# Patient Record
Sex: Male | Born: 1980 | Race: White | Hispanic: No | Marital: Single | Smoking: Current every day smoker
Health system: Southern US, Community
[De-identification: ages and names within clinical notes are randomized; demographics above are authoritative.]

## PROBLEM LIST (undated history)

## (undated) DIAGNOSIS — G629 Polyneuropathy, unspecified: Secondary | ICD-10-CM

## (undated) DIAGNOSIS — E669 Obesity, unspecified: Secondary | ICD-10-CM

## (undated) DIAGNOSIS — I10 Essential (primary) hypertension: Secondary | ICD-10-CM

## (undated) DIAGNOSIS — F17200 Nicotine dependence, unspecified, uncomplicated: Secondary | ICD-10-CM

## (undated) DIAGNOSIS — E119 Type 2 diabetes mellitus without complications: Secondary | ICD-10-CM

## (undated) DIAGNOSIS — A630 Anogenital (venereal) warts: Secondary | ICD-10-CM

## (undated) DIAGNOSIS — F329 Major depressive disorder, single episode, unspecified: Secondary | ICD-10-CM

## (undated) DIAGNOSIS — J301 Allergic rhinitis due to pollen: Secondary | ICD-10-CM

## (undated) DIAGNOSIS — K2100 Gastro-esophageal reflux disease with esophagitis, without bleeding: Secondary | ICD-10-CM

## (undated) DIAGNOSIS — G43909 Migraine, unspecified, not intractable, without status migrainosus: Secondary | ICD-10-CM

## (undated) DIAGNOSIS — N410 Acute prostatitis: Secondary | ICD-10-CM

## (undated) DIAGNOSIS — G4700 Insomnia, unspecified: Secondary | ICD-10-CM

## (undated) DIAGNOSIS — J45909 Unspecified asthma, uncomplicated: Secondary | ICD-10-CM

## (undated) DIAGNOSIS — M5137 Other intervertebral disc degeneration, lumbosacral region: Secondary | ICD-10-CM

## (undated) DIAGNOSIS — F32A Depression, unspecified: Secondary | ICD-10-CM

## (undated) DIAGNOSIS — M545 Low back pain, unspecified: Secondary | ICD-10-CM

## (undated) DIAGNOSIS — K219 Gastro-esophageal reflux disease without esophagitis: Secondary | ICD-10-CM

## (undated) DIAGNOSIS — K589 Irritable bowel syndrome without diarrhea: Secondary | ICD-10-CM

## (undated) DIAGNOSIS — Z833 Family history of diabetes mellitus: Secondary | ICD-10-CM

## (undated) DIAGNOSIS — M51379 Other intervertebral disc degeneration, lumbosacral region without mention of lumbar back pain or lower extremity pain: Secondary | ICD-10-CM

## (undated) HISTORY — DX: Essential (primary) hypertension: I10

## (undated) HISTORY — DX: Other intervertebral disc degeneration, lumbosacral region without mention of lumbar back pain or lower extremity pain: M51.379

## (undated) HISTORY — DX: Low back pain, unspecified: M54.50

## (undated) HISTORY — PX: OTHER SURGICAL HISTORY: SHX169

## (undated) HISTORY — DX: Major depressive disorder, single episode, unspecified: F32.9

## (undated) HISTORY — DX: Gastro-esophageal reflux disease without esophagitis: K21.9

## (undated) HISTORY — DX: Migraine, unspecified, not intractable, without status migrainosus: G43.909

## (undated) HISTORY — DX: Gastro-esophageal reflux disease with esophagitis, without bleeding: K21.00

## (undated) HISTORY — DX: Low back pain: M54.5

## (undated) HISTORY — DX: Other intervertebral disc degeneration, lumbosacral region: M51.37

## (undated) HISTORY — DX: Acute prostatitis: N41.0

## (undated) HISTORY — DX: Obesity, unspecified: E66.9

## (undated) HISTORY — DX: Family history of diabetes mellitus: Z83.3

## (undated) HISTORY — DX: Insomnia, unspecified: G47.00

## (undated) HISTORY — DX: Gastro-esophageal reflux disease with esophagitis: K21.0

## (undated) HISTORY — DX: Allergic rhinitis due to pollen: J30.1

## (undated) HISTORY — DX: Depression, unspecified: F32.A

## (undated) HISTORY — DX: Anogenital (venereal) warts: A63.0

## (undated) HISTORY — DX: Irritable bowel syndrome, unspecified: K58.9

## (undated) HISTORY — DX: Nicotine dependence, unspecified, uncomplicated: F17.200

---

## 2007-04-05 HISTORY — PX: ESOPHAGOGASTRODUODENOSCOPY: SHX1529

## 2008-05-08 ENCOUNTER — Ambulatory Visit: Payer: Self-pay | Admitting: Gastroenterology

## 2008-05-08 LAB — CONVERTED CEMR LAB
AST: 15 units/L (ref 0–37)
Albumin: 4.5 g/dL (ref 3.5–5.2)
CO2: 20 meq/L (ref 19–32)
Calcium: 8.8 mg/dL (ref 8.4–10.5)
Chloride: 105 meq/L (ref 96–112)
Creatinine, Ser: 1.06 mg/dL (ref 0.40–1.50)
Eosinophils Absolute: 0.6 10*3/uL (ref 0.0–0.7)
Glucose, Bld: 85 mg/dL (ref 70–99)
HCT: 47.3 % (ref 39.0–52.0)
Lymphs Abs: 2.6 10*3/uL (ref 0.7–4.0)
MCHC: 34 g/dL (ref 30.0–36.0)
MCV: 86.5 fL (ref 78.0–100.0)
Platelets: 276 10*3/uL (ref 150–400)
Potassium: 4.2 meq/L (ref 3.5–5.3)
RBC: 5.47 M/uL (ref 4.22–5.81)
Sodium: 141 meq/L (ref 135–145)
WBC: 9.7 10*3/uL (ref 4.0–10.5)

## 2008-08-19 DIAGNOSIS — R1110 Vomiting, unspecified: Secondary | ICD-10-CM

## 2008-08-19 DIAGNOSIS — K625 Hemorrhage of anus and rectum: Secondary | ICD-10-CM

## 2008-08-19 DIAGNOSIS — R109 Unspecified abdominal pain: Secondary | ICD-10-CM | POA: Insufficient documentation

## 2008-08-19 DIAGNOSIS — R197 Diarrhea, unspecified: Secondary | ICD-10-CM

## 2009-02-05 ENCOUNTER — Encounter: Payer: Self-pay | Admitting: Orthopedic Surgery

## 2009-02-05 ENCOUNTER — Ambulatory Visit (HOSPITAL_COMMUNITY): Admission: RE | Admit: 2009-02-05 | Discharge: 2009-02-05 | Payer: Self-pay | Admitting: Family Medicine

## 2009-04-03 ENCOUNTER — Encounter: Payer: Self-pay | Admitting: Orthopedic Surgery

## 2009-04-03 ENCOUNTER — Ambulatory Visit (HOSPITAL_COMMUNITY): Admission: RE | Admit: 2009-04-03 | Discharge: 2009-04-03 | Payer: Self-pay | Admitting: Family Medicine

## 2009-04-20 ENCOUNTER — Ambulatory Visit: Payer: Self-pay | Admitting: Orthopedic Surgery

## 2009-04-20 DIAGNOSIS — M659 Unspecified synovitis and tenosynovitis, unspecified site: Secondary | ICD-10-CM | POA: Insufficient documentation

## 2009-06-17 ENCOUNTER — Telehealth (INDEPENDENT_AMBULATORY_CARE_PROVIDER_SITE_OTHER): Payer: Self-pay

## 2009-06-17 ENCOUNTER — Ambulatory Visit: Payer: Self-pay | Admitting: Gastroenterology

## 2009-06-17 DIAGNOSIS — R1013 Epigastric pain: Secondary | ICD-10-CM

## 2009-06-17 DIAGNOSIS — K3189 Other diseases of stomach and duodenum: Secondary | ICD-10-CM

## 2009-07-07 ENCOUNTER — Ambulatory Visit (HOSPITAL_COMMUNITY)
Admission: RE | Admit: 2009-07-07 | Discharge: 2009-07-07 | Payer: Self-pay | Source: Home / Self Care | Admitting: Gastroenterology

## 2010-01-02 ENCOUNTER — Encounter: Payer: Self-pay | Admitting: Orthopedic Surgery

## 2010-01-06 ENCOUNTER — Ambulatory Visit: Payer: Self-pay | Admitting: Orthopedic Surgery

## 2010-01-06 DIAGNOSIS — M7580 Other shoulder lesions, unspecified shoulder: Secondary | ICD-10-CM

## 2010-01-06 DIAGNOSIS — M25519 Pain in unspecified shoulder: Secondary | ICD-10-CM | POA: Insufficient documentation

## 2010-01-08 ENCOUNTER — Telehealth: Payer: Self-pay | Admitting: Orthopedic Surgery

## 2010-05-03 ENCOUNTER — Encounter (HOSPITAL_COMMUNITY)
Admission: RE | Admit: 2010-05-03 | Discharge: 2010-05-18 | Payer: Self-pay | Source: Home / Self Care | Attending: Orthopedic Surgery | Admitting: Orthopedic Surgery

## 2010-05-03 ENCOUNTER — Ambulatory Visit (HOSPITAL_COMMUNITY)
Admission: RE | Admit: 2010-05-03 | Discharge: 2010-05-03 | Payer: Self-pay | Source: Home / Self Care | Attending: Family Medicine | Admitting: Family Medicine

## 2010-05-18 NOTE — Assessment & Plan Note (Signed)
Summary: ABD PAIN, DIARRHEA, GERD   Visit Type:  Follow-up Visit Primary Care Provider:  Sudie Bailey, M.D.  Chief Complaint:  F/U abd pain.  History of Present Illness: Having problems with acid coming back. Cut back on coffee. Lost 22 lbs since last year-been not eating a lot. Was scared and he didn't want to be asleep for 3 days.  Feells like food gets stuck: 2-3x/week. Vomtis: s/t with eating, smoking, or drinking. hasn't stopped smoking yet but is on Chantix and it didn't work. CHANTIX CAUSED SUICIDAL THOUGHTS. Rare problems with pain with swallowing. Feeling depressed-not seeing a [facility].  For vomtiing only using OMP. May awaken with reflux: most nights with BM or throats on fire.  Problems with diarrhea, about the same. BMs: 4-6/day. Rare blood: ? frequency. Pain sometimes in belly, BLQ. Watery stool, and can have normal stool. No dysuria, hematuria, SOB. Not using anything for diarrhea.  Preventive Screening-Counseling & Management  Alcohol-Tobacco     Smoking Status: current      Drug Use:  no.    Current Medications (verified): 1)  Tylenol 325 Mg Tabs (Acetaminophen) .... As Needed 2)  Advil 200 Mg Tabs (Ibuprofen) .... As Needed 3)  Omeprazole 20 Mg Cpdr (Omeprazole) .... Take 1 Tablet By Mouth Two Times A Day  Allergies: 1)  ! Pcn  Past History:  Past Medical History: Last updated: 04/20/2009 reflux depression  seasonal allergies migraines  Past Surgical History: Last updated: 04/20/2009 na  Family History: FH of Colon Cancer: mother age 33 No family history polyps maternal grandmother had stomach/ovarian CA  Social History: Occupation: disability from mental illness Patient currently smokes.  Alcohol Use - no Illicit Drug Use - no Single, just lost a child Smoking Status:  current Drug Use:  no  Review of Systems       Per HPI, otherwise all systems negative.  May have jock itch. No joint pain, rash on legs, or sores in his mouth. Left shoulder feels  better.  Vital Signs:  Patient profile:   29 year old male Height:      63 inches Weight:      259 pounds BMI:     46.05 Temp:     98.0 degrees F oral Pulse rate:   84 / minute BP sitting:   130 / 92  (left arm) Cuff size:   large  Vitals Entered By: Cloria Spring LPN (June 17, 1608 10:15 AM)  Physical Exam  General:  Well developed, well nourished, no acute distress. Head:  Normocephalic and atraumatic. Eyes:  PERRLA, no icterus. Mouth:  No deformity or lesions, dentition POOR. Neck:  Supple; no masses. Lungs:  Clear throughout to auscultation. Heart:  Regular rate and rhythm; no murmurs. Abdomen:  Soft, nontender and nondistended. No masses, hepatosplenomegaly or hernias noted. Normal bowel sounds. obese.   Extremities:  No RASH, cyanosis, edema or deformities noted. Neurologic:  Alert and  oriented x4;  grossly normal neurologically.  Impression & Recommendations:  Problem # 1:  DYSPEPSIA (ICD-536.8)  Most likely 2o to non-ulcer dyspepsia or non-acid reflux. Doubt H. pylori gastritis. EGD BRAVO PLACEMENT IN OR MAR 24. STUDY ON two times a day OMP. BIOPSY DUODENUM to evaluate for celiac sprue. Continue omeprazole. Take omeprazole 30 minutes prior to meals two times a day. RETURN VISIT IN 3 MONTHS.  Orders: Est. Patient Level V (96045)  Problem # 2:  RECTAL BLEEDING (ICD-569.3) Most likely 2o to hemorrhoids. Differential includes colon polyp, less likely colon cancer. TCS  WITH RANDOM BIOPSIES TO evaluate for microscopic colitis. Halflytely prep.   Problem # 3:  DIARRHEA (ICD-787.91) Assessment: Unchanged Most likely 2o to IBS-d, doubt celiac sprue or microscopic colitism giardiasis, or cdiff. Stool studies. Avoid dairy. SEE HANDOUT. Take Digestive Advantage Lactose Intolerance daily. Need upper and lower endoscopy MAR 24. ADD LEVSIN 1-2 under your tongue 30 mins before meals. LEVSIN helps with diarrhea and abd cramps.  Orders: T-Stool Giardia / Crypto- EIA  (34742) T-Fecal WBC (59563-87564) T-Culture, C-Diff Toxin A/B (33295-18841) T-Culture, C-Diff Toxin A/B (66063-01601) T-Culture, C-Diff Toxin A/B (09323-55732) Est. Patient Level V (20254)  Patient Instructions: 1)  Continue omeprazole.  2)  Take omeprazole 30 minutes prior to meals two times a day. 3)  Avoid dairy. SEE HANDOUT. 4)  Take Digestive Advantage Lactose Intolerance daily. 5)  Need upper and lower endoscopy MAR 24. 6)  ADD LEVSIN 1-2 under your tongue 30 mins before meals. 7)  LEVSIN helps with diarrhea and abd cramps. 8)  RETURN VISIT IN 3 MONTHS. 9)  The medication list was reviewed and reconciled.  All changed / newly prescribed medications were explained.  A complete medication list was provided to the patient / caregiver. Prescriptions: LEVSIN/SL 0.125 MG SUBL (HYOSCYAMINE SULFATE) 1-2 sl 30 minutes before meals up to 4 times a day  #90 x 5   Entered and Authorized by:   West Bali MD   Signed by:   West Bali MD on 06/17/2009   Method used:   Electronically to        [facility] (retail)       [address] [place], [place]  [address]       Ph: [phone]       Fax: [phone]   RxID:   [id number] OMEPRAZOLE 20 MG CPDR (OMEPRAZOLE) Take 1 tablet by mouth two times a day, 30 minute before meals  #60 x 5   Entered and Authorized by:   West Bali MD   Signed by:   West Bali MD on 06/17/2009   Method used:   Electronically to        [facility] (retail)       [address]       [place], [place]  [address]       Ph: [phone]       Fax: [phone]   RxID:   [id number]

## 2010-05-18 NOTE — Letter (Signed)
Summary: History form  History form   Imported By: Jacklynn Ganong 04/21/2009 15:40:25  _____________________________________________________________________  External Attachment:    Type:   Image     Comment:   External Document

## 2010-05-18 NOTE — Progress Notes (Signed)
Summary: Referral to Dr. Dion Saucier.  Phone Note Outgoing Call   Call placed by: Waldon Reining,  January 08, 2010 10:09 AM Call placed to: Specialist Action Taken: Information Sent Summary of Call: I faxed a referral for this patient to Dr. Dion Saucier to be seen for his shoulder pain, synovitis.

## 2010-05-18 NOTE — Assessment & Plan Note (Signed)
Summary: RE-CK LT SHOULDER/PAIN RECURRING/POSS.NEW XRAY/[place] MEDICAID/CAF   Visit Type:  Follow-up Referring Allen Palmer:  Dr. Sudie Palmer Primary Allen Palmer:  Allen Palmer, M.D.  CC:  left shoulder pain.  History of Present Illness: I saw Allen Palmer in the office today for a followup visit.  He is a 30 years old man with the complaint of:  left shoulder pain.  I last saw him in January of this year and he had an MRI and a plain film and on MRI and a large joint effusion with perhaps synovitis no tears.  Left shoulder MRI and Xray for review from 02/05/09.  he was treated with injection, cortisone and Lorcet plus and got relief for approximately 3 days.  He was seen again by his primary care physician who gave him what I think was a subacromial injection as well as an intramuscular deltoid injection on the LEFT and increases pain medication to Lorcet 10 mg but he did not improve and is referred back  His pain is worse at night and at 7/10   Allergies: 1)  ! Pcn  Past History:  Past Medical History: Last updated: 04/20/2009 reflux depression  seasonal allergies migraines  Past Surgical History: Last updated: 04/20/2009 na  Family History: Last updated: 06/17/2009 FH of Colon Cancer: mother age 82 No family history polyps maternal grandmother had stomach/ovarian CA  Social History: Last updated: 06/17/2009 Occupation: disability from mental illness Patient currently smokes.  Alcohol Use - no Illicit Drug Use - no Single, just lost a child  Risk Factors: Smoking Status: current (06/17/2009)  Review of Systems Constitutional:  Denies weight loss, weight gain, fever, chills, and fatigue. Neurologic:  Denies numbness and tingling. Musculoskeletal:  Complains of joint pain.  Physical Exam  Additional Exam:  This is a fairly large male in no acute distress with no gross deformity  He has normal distal pulses with no palpable abnormalities.  As no lymphadenopathy.  Skin is  warm dry and intact with no rash.  Has normal sensation his LEFT upper extremity and he's awake alert and oriented x3.  He ambulates normally  On inspection there is no warmth or tenderness to palpation around the shoulder.  His pain is with range of motion.  This includes abduction flexion and internal and external rotation.  However his passive range of motion is normal.  His active range of motion is limited to 90 of forward elevation 90 of abduction  I did take no history deficits in his rotator cuff on internal or external rotation or forward elevation.  Empty can test normal.  Shoulder feels stable with  inferior stress as well as abduction external rotation.  I do see some  some mild impingement and some pain with abduction external rotation at the extreme of motion.     Impression & Recommendations: very unclear as to what is going on with his shoulder.  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  He may just have impingement.  Like him to get a second opinion and return to me and let me review that T., with the treatment plan.  I think the pain medication and is going to be an issue or may become an issue and I would like to limit that and put him on an anti-inflammatory instead  Medications Added to Medication List This Visit: 1)  Celebrex 200 Mg Caps (Celecoxib) .Marland Kitchen.. 1 daily orally   h/o reflux  Other Orders: Orthopedic Surgeon Referral (  Ortho Surgeon) Est. Patient Level III [phone])  Patient Instructions: 1)  2nd opinion with Dr Dion Saucier (Shoulder Specialist)  2)  Take celebrex 200 mg daily  3)  return after 2nd opinion  Prescriptions: CELEBREX 200 MG CAPS (CELECOXIB) 1 daily orally   h/o reflux  #60 x 1   Entered and Authorized by:   Fuller Canada MD   Signed by:   Fuller Canada MD on 01/06/2010   Method used:   Faxed to ...       [facility] (retail)       [address]       [place], [place]  [address]       Ph: [phone]       Fax: [phone]   RxID:   [id number]

## 2010-05-18 NOTE — Progress Notes (Signed)
Summary: levsin too expensive  Phone Note Call from Patient Call back at Home Phone [phone]   Caller: Patient Summary of Call: pt called- went to pharmacy and levsin is $55.00 and he cant afford it. wants to know if there is anything cheaper he can take? please advise Initial call taken by: Hendricks Limes LPN,  June 17, 2009 3:20 PM     Appended Document: levsin too expensive Change to Dicyclomine 10 mg 1-2 by mouth qac, up to 4 times a day, #90 rfx5.  Appended Document: levsin too expensive Rx called to [facility], pts mother aware

## 2010-05-18 NOTE — Letter (Signed)
Summary: tcs/egd order  tcs/egd order   Imported By: Ave Filter 06/17/2009 11:46:11  _____________________________________________________________________  External Attachment:    Type:   Image     Comment:   External Document  Appended Document: tcs/egd order Pt no showed x2 for procedure per slf made pt a f/u visit with her 09/24/09@9 :30a.m.

## 2010-05-18 NOTE — Letter (Signed)
Summary: *Orthopedic Consult Note  [facility]  [address] [place] [address]  [place], [place] [address]   Phone: [phone]  Fax: [phone]    Re:    Allen Palmer DOB:    1980/04/19   Dear: Brett Canales    Thank you for requesting that we see the above patient for consultation.  A copy of the detailed office note will be sent under separate cover, for your review.  Evaluation today is consistent with: benign synovitis of the LEFT shoulder. Recommendation is for injection and oral prednisone for 6 weeks.       Thank you for this opportunity to look after your patient.  Sincerely,   Terrance Mass. MD.

## 2010-05-18 NOTE — Letter (Signed)
Summary: Referral notes from Dr. Sudie Bailey  Referral notes from Dr. Sudie Bailey   Imported By: Jacklynn Ganong 01/08/2010 10:35:24  _____________________________________________________________________  External Attachment:    Type:   Image     Comment:   External Document

## 2010-05-18 NOTE — Assessment & Plan Note (Signed)
Summary: LT SHOULDER PAIN/HAD XR/MRI [facility] 04/03/09,BRING'G FILM/REF KNO...   Vital Signs:  Patient profile:   30 year old male Weight:      247 pounds Pulse rate:   84 / minute Resp:     16 per minute  Vitals Entered By: Fuller Canada MD (April 20, 2009 1:56 PM)  Visit Type:  Initial Consult Primary Provider:  Dr Sudie Bailey   CC:  left shoulder pain.  History of Present Illness: I saw Allen Palmer in the office today for an initial visit.  He is a 30 years old man with the complaint of:  chief complaint: left shoulder pain  30 year old male with pain in his LEFT shoulder for 3 months. He rates it an 8/10. Denies any trauma. Pain is worse at night improved Lorcet plus, and aspirin. He describes it as aching, throbbing, sometimes sharp associated with painful range of motion. He denies any stiffness or catching, no locking, or popping. No weakness no numbness.  -xrays done & where: Left shoulder 02/05/09, MRI 04/03/09 for review.  No injections or therapy.  Meds: Omeprazole, Cipro, Dicyclomine, Clindamycin, Fexofenadine, Hydrocodone 7.5.  Allergies: 1)  ! Pcn  Past History:  Past Medical History: reflux depression  seasonal allergies migraines  Past Surgical History: na  Family History: na  Social History: na  Review of Systems General:  Complains of fever; denies weight loss, weight gain, chills, and fatigue. Cardiac :  Complains of poor circulation; denies chest pain, angina, heart attack, heart failure, blood clots, and phlebitis. Resp:  Complains of cough; denies short of breath, difficulty breathing, COPD, and pneumonia. GI:  Complains of vomiting, diarrhea, constipation, and reflux; denies nausea, difficulty swallowing, ulcers, and GERD. Neuro:  Complains of headache, dizziness, and migraines; denies numbness, weakness, tremor, and unsteady walking. MS:  Complains of joint pain and joint swelling; denies rheumatoid arthritis, gout, bone cancer,  osteoporosis, and . Psych:  Complains of depression; denies mood swings, anxiety, panic attack, bipolar, and schizophrenia. Immunology:  Complains of seasonal allergies, sinus problems, and allergic to bee stings.  The review of systems is negative for GU, Endo, Derm, EENT, and Lymphatic.  Physical Exam  Msk:  vsigns are stable as recorded  Medium to large body habitus,  He's oriented x3.  His mood is flat.  Gait and station are normal.  Skin is normal. The LEFT and RIGHT shoulder, as well as the cervical spine and upper back.  He has normal radial and ulnar pulses without temperature no edema in either arm. He has negative lymphadenopathy in the axilla. Normal sensation in both upper extremities. His reflexes are excellent coordination and balance are good.  His RIGHT and LEFT shoulder on inspection show no swelling. He has full range of motion in both with increased pain in [place] elevation on the LEFT side. Both shoulders are stable. Strength is normal.     Impression & Recommendations:  Problem # 1:  SYNOVITIS (ICD-727.00)  Data: MRI of the LEFT shoulder with x-rays of LEFT shoulder reviewed with their corresponding reports I agree that the plain films are normal in that there is no evidence of rotator cuff or labral injury, there is fluid in the joint, consistent with a synovitis.  Inject LEFT shoulder Verbal consent obtained/The shoulder was injected with depomedrol 40mg /cc and sensorcaine .25% . There were no complications   Assessment: The MRI was normal. There was no trauma to bring on the pain is a nonspecific synovitis. Recommend medical treatment with prednisone. Followup if  not improved after 6 weeks  Orders: Consultation Level III ([address]) Joint Aspirate / Injection, Large ([address]10) Depo- Medrol 40mg  (J1030)  Medications Added to Medication List This Visit: 1)  Lorcet Plus 7.5-650 Mg Tabs (Hydrocodone-acetaminophen) .... One by mouth q 4 hrs as needed pain 2)   Prednisone 10 Mg Tabs (Prednisone) .... One by mouth daily for 6 weeks  Patient Instructions: 1)  You have received an injection of cortisone today. You may experience increased pain at the injection site. Apply ice pack to the area for 20 minutes every 2 hours and take 2 xtra strength tylenol every 8 hours. This increased pain will usually resolve in 24 hours. The injection will take effect in 3-10 days.  2)  Take this medicine once a day for 6 weeks 3)  call us after 6 weeks if not any better. Prescriptions: PREDNISONE 10 MG TABS (PREDNISONE) one by mouth daily for 6 weeks  #42 x 0   Entered and Authorized by:   Fuller Canada MD   Signed by:   Fuller Canada MD on 04/20/2009   Method used:   Print then Give to Patient   RxID:   [id number]

## 2010-05-31 ENCOUNTER — Ambulatory Visit (HOSPITAL_COMMUNITY): Payer: Self-pay | Admitting: Specialist

## 2010-07-12 LAB — BASIC METABOLIC PANEL
GFR calc Af Amer: >60 mL/min (ref >60)
Potassium: 3.7 mEq/L (ref 3.5–5.1)

## 2010-08-09 ENCOUNTER — Ambulatory Visit (HOSPITAL_COMMUNITY): Payer: Self-pay | Admitting: Specialist

## 2010-08-31 NOTE — Consult Note (Signed)
NAME:  Allen Palmer, Allen Palmer               ACCOUNT NO.:  [id number]   MEDICAL RECORD NO.:  [id number]          PATIENT TYPE:  AMB   LOCATION:  DAY                           FACILITY:  [facility]   PHYSICIAN:  Kassie Mends, M.D.           DATE OF BIRTH:   DATE OF CONSULTATION:  05/08/2008  DATE OF DISCHARGE:                                 CONSULTATION   REASON FOR CONSULTATION:  Abdominal pain, diarrhea, rectal bleeding, and  vomiting.   HISTORY OF PRESENT ILLNESS:  Allen Palmer is a 30 year old male who was  having normal daily stools until approximately 3 years ago.  He now can  have up to 3-4 bouts of diarrhea a day.  If he is not having diarrhea,  he is not having bowel movements at all.  He states he is stopped up. He  has pain in the right lower quadrant that radiates to his right upper  quadrant.  His pain is made worse by spicy foods and moving around.  Advil makes better.  He complains of swelling in his abdomen every  night.  He drinks 5 cans of soda (Pepsi or Coke) daily.  He drinks 4-6  cups of caffeinated coffee daily.  He has cream with his coffee. Dr.  Michelle Nasuti office refer to an abdominal ultrasound, which was  unremarkable. In December 2009, he was 264 pounds. If he takes his  Nexium, he does not have any symptoms.  He has also been tried on  omeprazole.  He has been off his medication for a year.   He also has been treated for reflux disease for the last 2-3 years.  He  complains of vomiting twice a day.  His vomiting usually occurs either  spontaneously in the morning or after he brushes his teeth.  His vomit  occurs after eating at nighttime.  He reports of having upper endoscopy  in [place], [place].  He had an upper endoscopy which he  became uncooperative during the exam and was a limited exam. He  sometimes has problems with swallowing both solids and liquids.  He uses  4 ibuprofens daily for his abdominal pain.  He also complains of seeing  blood in his  stool.  He is also complains of blood in his urine.  He has  never had a colonoscopy.  He has multiple fractured teeth in his mouth,  but complains of no teeth pain.  He rarely consumes milk.  He does not  consume ice cream or cheese.  He has been on antibiotics in the last 4  months.  He has well water.  He has not traveled outside of the country.   PAST MEDICAL HISTORY:  Abdominal pain.   PAST SURGICAL HISTORY:  None.   ALLERGIES:  PENICILLIN just about chilled me when I was 2.   MEDICATIONS:  1. Tylenol as needed.  2  Advil 4 daily.   FAMILY HISTORY:  His mother has reflux.  He has no family history colon  cancer or colon polyps.  He has a cousin with Crohn  disease.   SOCIAL HISTORY:  He is single and has no children.  He is currently on  Medicaid.  He smokes half a pack a day.  He denies any alcohol use.   REVIEW OF SYSTEMS:  Per the HPI, otherwise all systems are negative.   PHYSICAL EXAMINATION:  VITAL SIGNS:  Weight 269 pounds, height 6 feet 3  inches, temperature 97.9, blood pressure 128/80, and pulse 60.  GENERAL:  He is in no apparent distress and smells like cigarette smoke.  He is alert and oriented x4.HEENT:  Atraumatic and normocephalic.  Pupils equal and reactive to light.  Mouth, no oral lesions.  Posterior  oropharynx without erythema or exudate.  He has extremely poor  dentition.NECK:  Full range of motion.  No lymphadenopathy.LUNGS:  Clear  to auscultation bilaterally.CARDIOVASCULAR:  Regular rhythm.  No murmur.  Normal S1 and S2.ABDOMEN:  Bowel sounds are present, soft, nondistended,  obese, no hepatosplenomegaly.  No abdominal bruits.  He has increasing  pain in his right lower quadrant and right flank with Carnett maneuvers  consistent with abdominal wall pain.EXTREMITIES:  No cyanosis or  edema.SKIN:  No pretibial lesions.NEUROLOGIC:  No focal neurologic  deficits.   ASSESSMENT:  Allen Palmer is a 30 year old male most likely has abdominal  pain which is  multifactorial.  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  He has vomiting, which is likely secondary to uncontrolled  gastroesophageal reflux disease.  The differential diagnosis also  includes NSAID gastritis or H. pylori gastritis.  He has diarrhea and  rectal bleeding, which is likely secondary to irritable bowel syndrome  with hemorrhoids.  However, the differential diagnosis includes NSAID  colitis, and a low likelihood of inflammatory bowel disease or  colorectal polyps or malignancy.  He could also have celiac sprue. Thank  you for allowing me to see Allen Palmer in consultation.  My  recommendations as follow.   RECOMMENDATIONS:  1. He is to start omeprazole 20 mg twice daily.  He was instructed to      take 30 minutes before his first and last meal.  We will also add      Bentyl 10 mg 30 minutes before his first and last meal.  He is      warned that Bentyl may cause drowsiness, dry eyes, dry mouth, and      urinary retention.  2. Will obtain CBC, complete metabolic panel, fecal lactoferrin, C.      diff toxin, routine stool culture, and Giardia antigen.  Has a low      likelihood that his chronic diarrhea is secondary to an infectious      process, but it remains in the differential diagnosis (Giardiasis,      C. diff colitis, and bacterial gastroenteritis).  3. He was extremely agitated with his upper endoscopy in [facility] and so he will have a upper endoscopy and a colonoscopy on      next week with propofol.  The endoscopy will be performed to      evaluate his rectal bleeding, vomiting, diarrhea, and abdominal      pain.  He has had half lightly bowel prep.  We will biopsy his      colon and his duodenum.  He has a low likelihood that microscopic      colitis could be causing his diarrhea.  4. He was given the [facility] handout and I explained to him.  I      recommended that  he lose to 10-20 pounds, stop smoking, and stop      consuming a large quantity of caffeinated beverages, and carbonated      beverages.  He was given a handout on items and which may cause      bloating.  He also was given a low-fat diet handout.  5. Will attempt to obtain records from Dr. Sudie Bailey or [facility]      to obtain the actual radiographic and      endoscopic reports.  6. He has a follow up appointment to see me in 2 months.   ADDENDUM 78295:  Pt has not had EGD/TCS performed.      Kassie Mends, M.D.  Electronically Signed     SM/MEDQ  D:  05/08/2008  T:  05/09/2008  Job:  [id number]   cc:   Mila Homer. Sudie Bailey, M.D.  Fax: [phone]

## 2011-08-04 NOTE — H&P (Signed)
NTS SOAP Note  Vital Signs:  Vitals as of: 08/04/2011: Systolic 143: Diastolic 96: Heart Rate 104: Temp 99.95F: Height 46ft 3in: Weight 297Lbs 0 Ounces: OFC 0in: Respiratory Rate 0: O2 Saturation 0: Pain Level 8: BMI 37  BMI : 37.12 kg/m2  Subjective: This 31 Years 1 Months old Male presents for of anal wart.  They have been present for some time, but have not been medically addressed.  Patient did not want to discuss sexually history.  Review of Symptoms:  Constitutional:fatigue headaches Eyes:blurred vision bilateral,pain bilateral ear and sinus infection Cardiovascular:unremarkable Respiratory:wheezing,cough abdominal issues Genitourinary:unremarkable joint, back, neck pain Skin:unremarkable Hematolgic/Lymphatic:unremarkable Allergic/Immunologic:unremarkable   Past Medical History:Reviewed   Past Medical History  Surgical History: unremarkable Medical Problems: HTN, asthma Allergies: PCN Medications: HCTZ, omeprazole, proair, kdur, temazepam, celebrex   Social History:Reviewed  Social History  Preferred Language: English ([place]) Race:  White Ethnicity: Not Hispanic / Latino Age: 31 Years 1 Months Marital Status:  S Sexual Activity: could not discuss as mother in the room Alcohol:  No Recreational drug(s):  No   Smoking Status: Current every day smoker reviewed on 08/04/2011 Started Date: 04/18/2000 Packs per day: 0.50   Family History:Reviewed   Family History  Is there a family history of:No family h/o colon carcinoma    Objective Information: General:Well appearing, well nourished in no distress. Head:Atraumatic; no masses; no abnormalities Heart:RRR, no murmur or gallop.  Normal S1, S2.  No S3, S4.  Lungs:CTA bilaterally, no wheezes, rhonchi, rales.  Breathing unlabored. wart like lesions at the anal verge and anterior dentate line  Assessment:Condylloma, anal  Diagnosis  &amp; Procedure: DiagnosisCode: 078.11, ProcedureCode: 57846,    Plan:Will schedule for rectal examination, fulgeration of anal condyloma on 08/17/11.   Patient Education:Alternative treatments to surgery were discussed with patient (and family).Risks and benefits  of procedure were fully explained to the patient (and family) who gave informed consent. Patient/family questions were addressed.  Follow-up:Pending Surgery

## 2011-08-11 ENCOUNTER — Encounter (HOSPITAL_COMMUNITY): Payer: Self-pay | Admitting: Pharmacy Technician

## 2011-08-11 NOTE — Patient Instructions (Signed)
[name]  08/11/2011   Your procedure is scheduled on: 5.1.13  Report to [facility] at 0700 AM.  Call this number if you have problems the morning of surgery: [phone]   Remember:   Do not eat food:After Midnight.  May have clear liquids:until Midnight .  Clear liquids include soda, tea, black coffee, apple or grape juice, broth.  Take these medicines the morning of surgery with A SIP OF WATER : omeprazole, proair   Do not wear jewelry, make-up or nail polish.  Do not wear lotions, powders, or perfumes. You may wear deodorant.  Do not shave 48 hours prior to surgery.  Do not bring valuables to the hospital.  Contacts, dentures or bridgework may not be worn into surgery.  Leave suitcase in the car. After surgery it may be brought to your room.  For patients admitted to the hospital, checkout time is 11:00 AM the day of discharge.   Patients discharged the day of surgery will not be allowed to drive home.  Name and phone number of your driver: family  Special Instructions: CHG Shower Use Special Wash: 1/2 bottle night before surgery and 1/2 bottle morning of surgery.   Please read over the following fact sheets that you were given: Pain Booklet, MRSA Information, Surgical Site Infection Prevention, Anesthesia Post-op Instructions and Care and Recovery After Surgery   PATIENT INSTRUCTIONS POST-ANESTHESIA  IMMEDIATELY FOLLOWING SURGERY:  Do not drive or operate machinery for the first twenty four hours after surgery.  Do not make any important decisions for twenty four hours after surgery or while taking narcotic pain medications or sedatives.  If you develop intractable nausea and vomiting or a severe headache please notify your doctor immediately.  FOLLOW-UP:  Please make an appointment with your surgeon as instructed. You do not need to follow up with anesthesia unless specifically instructed to do so.  WOUND CARE INSTRUCTIONS (if applicable):  Keep a dry clean dressing on  the anesthesia/puncture wound site if there is drainage.  Once the wound has quit draining you may leave it open to air.  Generally you should leave the bandage intact for twenty four hours unless there is drainage.  If the epidural site drains for more than 36-48 hours please call the anesthesia department.  QUESTIONS?:  Please feel free to call your physician or the hospital operator if you have any questions, and they will be happy to assist you.     [facility] Anesthesia Department [address] [place] [phone]

## 2011-08-12 ENCOUNTER — Encounter (HOSPITAL_COMMUNITY)
Admission: RE | Admit: 2011-08-12 | Discharge: 2011-08-12 | Payer: Medicare Other | Source: Ambulatory Visit | Attending: General Surgery | Admitting: General Surgery

## 2011-08-17 ENCOUNTER — Encounter (HOSPITAL_COMMUNITY): Admission: RE | Payer: Self-pay | Source: Ambulatory Visit

## 2011-08-17 ENCOUNTER — Encounter (HOSPITAL_COMMUNITY): Payer: Self-pay | Admitting: Anesthesiology

## 2011-08-17 ENCOUNTER — Ambulatory Visit (HOSPITAL_COMMUNITY): Admission: RE | Admit: 2011-08-17 | Payer: Medicare Other | Source: Ambulatory Visit | Admitting: General Surgery

## 2011-08-17 SURGERY — EXAM UNDER ANESTHESIA
Anesthesia: Choice

## 2011-08-29 ENCOUNTER — Encounter (HOSPITAL_COMMUNITY): Admission: RE | Admit: 2011-08-29 | Discharge: 2011-08-29 | Payer: Medicare Other | Source: Ambulatory Visit

## 2011-08-29 NOTE — Patient Instructions (Addendum)
  [name]  08/29/2011   Your procedure is scheduled on:  5.17.13  Report to [facility] at 0720 AM.  Call this number if you have problems the morning of surgery: [phone]   Remember:   Do not eat food:After Midnight.  May have clear liquids:until Midnight .  Clear liquids include soda, tea, black coffee, apple or grape juice, broth.  Take these medicines the morning of surgery with A SIP OF WATER: albuterol, celebrex   Do not wear jewelry, make-up or nail polish.  Do not wear lotions, powders, or perfumes. You may wear deodorant.  Do not shave 48 hours prior to surgery.  Do not bring valuables to the hospital.  Contacts, dentures or bridgework may not be worn into surgery.  Leave suitcase in the car. After surgery it may be brought to your room.  For patients admitted to the hospital, checkout time is 11:00 AM the day of discharge.   Patients discharged the day of surgery will not be allowed to drive home.  Name and phone number of your driver: family  Special Instructions: CHG Shower Use Special Wash: 1/2 bottle night before surgery and 1/2 bottle morning of surgery.   Please read over the following fact sheets that you were given: Pain Booklet, MRSA Information, Surgical Site Infection Prevention, Anesthesia Post-op Instructions and Care and Recovery After Surgery   PATIENT INSTRUCTIONS POST-ANESTHESIA  IMMEDIATELY FOLLOWING SURGERY:  Do not drive or operate machinery for the first twenty four hours after surgery.  Do not make any important decisions for twenty four hours after surgery or while taking narcotic pain medications or sedatives.  If you develop intractable nausea and vomiting or a severe headache please notify your doctor immediately.  FOLLOW-UP:  Please make an appointment with your surgeon as instructed. You do not need to follow up with anesthesia unless specifically instructed to do so.  WOUND CARE INSTRUCTIONS (if applicable):  Keep a dry clean dressing  on the anesthesia/puncture wound site if there is drainage.  Once the wound has quit draining you may leave it open to air.  Generally you should leave the bandage intact for twenty four hours unless there is drainage.  If the epidural site drains for more than 36-48 hours please call the anesthesia department.  QUESTIONS?:  Please feel free to call your physician or the hospital operator if you have any questions, and they will be happy to assist you.

## 2011-09-02 ENCOUNTER — Encounter (HOSPITAL_COMMUNITY): Admission: RE | Payer: Self-pay | Source: Ambulatory Visit

## 2011-09-02 ENCOUNTER — Ambulatory Visit (HOSPITAL_COMMUNITY): Admission: RE | Admit: 2011-09-02 | Payer: Medicare Other | Source: Ambulatory Visit | Admitting: General Surgery

## 2011-09-02 SURGERY — EXAM UNDER ANESTHESIA
Anesthesia: General

## 2011-10-10 ENCOUNTER — Ambulatory Visit (HOSPITAL_COMMUNITY)
Admission: RE | Admit: 2011-10-10 | Discharge: 2011-10-10 | Disposition: A | Discharge status: Home / Self Care | Payer: Medicare Other | Source: Ambulatory Visit | Attending: Family Medicine | Admitting: Family Medicine

## 2011-10-10 ENCOUNTER — Other Ambulatory Visit (HOSPITAL_COMMUNITY): Payer: Self-pay | Admitting: Family Medicine

## 2011-10-10 DIAGNOSIS — M545 Low back pain, unspecified: Secondary | ICD-10-CM | POA: Insufficient documentation

## 2011-10-10 DIAGNOSIS — M546 Pain in thoracic spine: Secondary | ICD-10-CM | POA: Insufficient documentation

## 2011-10-10 DIAGNOSIS — M51379 Other intervertebral disc degeneration, lumbosacral region without mention of lumbar back pain or lower extremity pain: Secondary | ICD-10-CM | POA: Insufficient documentation

## 2011-10-10 DIAGNOSIS — M5137 Other intervertebral disc degeneration, lumbosacral region: Secondary | ICD-10-CM | POA: Insufficient documentation

## 2012-01-04 ENCOUNTER — Encounter: Payer: Self-pay | Admitting: Gastroenterology

## 2012-01-04 ENCOUNTER — Ambulatory Visit: Payer: Medicare Other | Admitting: Gastroenterology

## 2012-01-12 ENCOUNTER — Encounter: Payer: Self-pay | Admitting: Gastroenterology

## 2012-01-12 ENCOUNTER — Ambulatory Visit (INDEPENDENT_AMBULATORY_CARE_PROVIDER_SITE_OTHER): Payer: Medicare Other | Admitting: Gastroenterology

## 2012-01-12 VITALS — BP 133/80 | HR 104 | Temp 98.1°F | Ht 75.0 in | Wt 314.4 lb

## 2012-01-12 DIAGNOSIS — R1013 Epigastric pain: Secondary | ICD-10-CM

## 2012-01-12 DIAGNOSIS — K3189 Other diseases of stomach and duodenum: Secondary | ICD-10-CM

## 2012-01-12 DIAGNOSIS — K219 Gastro-esophageal reflux disease without esophagitis: Secondary | ICD-10-CM

## 2012-01-12 DIAGNOSIS — R197 Diarrhea, unspecified: Secondary | ICD-10-CM

## 2012-01-12 MED ORDER — PEG 3350-KCL-NA BICARB-NACL 420 G PO SOLR: 4000.0000 mL | ORAL | Status: DC

## 2012-01-12 MED ORDER — ESOMEPRAZOLE MAGNESIUM 40 MG PO CPDR: 40.0000 mg | DELAYED_RELEASE_CAPSULE | Freq: Two times a day (BID) | ORAL | Status: DC

## 2012-01-12 MED ORDER — DICYCLOMINE HCL 10 MG PO CAPS
10.0000 mg | ORAL_CAPSULE | Freq: Three times a day (TID) | ORAL | Status: DC
Start: 2012-01-12 — End: 2012-06-21
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
## 2012-01-12 NOTE — Patient Instructions (Addendum)
Stop Protonix. Start taking Nexium 30 minutes before breakfast and dinner.   Start taking Bentyl 10 mg before meals and at bedtime. No more than 4 doses per day.  We have set you up for a colonoscopy and upper endoscopy in the near future. Further recommendations to follow shortly.

## 2012-01-17 DIAGNOSIS — K219 Gastro-esophageal reflux disease without esophagitis: Secondary | ICD-10-CM | POA: Insufficient documentation

## 2012-01-17 NOTE — Assessment & Plan Note (Signed)
Chronic. Notes upper abdominal pain as well as diffuse abdominal discomfort. Question non-ulcer dyspepsia, chronic abdominal pain, IBS-related. Proceed with EGD/TCS.

## 2012-01-17 NOTE — Assessment & Plan Note (Signed)
Significant, failed Protonix and Omeprazole. +nocturnal symptoms. Vague dysphagia noted. Wt up 55 lbs since 2011. Symptoms secondary to diet and behavior issues. Needs aggressive wt management, dietary modification. Proceed with EGD with small bowel biopsy to assess for celiac sprue at time of TCS. Possible dilation if needed; vague dysphagia may be secondary to uncontrolled GERD, unable to r/o esophageal web, ring, stricture. Wt gain is assuring though. Change PPI to Nexium BID.   Proceed with upper endoscopy/dilation  in the near future with Dr. Darrick Penna. The risks, benefits, and alternatives have been discussed in detail with patient. They have stated understanding and desire to proceed.  PROPOFOL

## 2012-01-17 NOTE — Progress Notes (Signed)
Faxed to PCP
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
## 2012-01-25 ENCOUNTER — Encounter (HOSPITAL_COMMUNITY): Payer: Self-pay

## 2012-01-31 ENCOUNTER — Inpatient Hospital Stay (HOSPITAL_COMMUNITY): Admission: RE | Admit: 2012-01-31 | Discharge: 2012-01-31 | Payer: Medicare Other | Source: Ambulatory Visit

## 2012-01-31 NOTE — Patient Instructions (Addendum)
[name]  01/31/2012   Your procedure is scheduled on:  02/07/2012  Report to [facility] at  615  AM.  Call this number if you have problems the morning of surgery: [phone]   Remember:   Do not eat food:After Midnight.  May have clear liquids:until Midnight .    Take these medicines the morning of surgery with A SIP OF WATER:  Nexium, hyzaar,lorcet. Take albuterol before you come.   Do not wear jewelry, make-up or nail polish.  Do not wear lotions, powders, or perfumes. You may wear deodorant.  Do not shave 48 hours prior to surgery. Men may shave face and neck.  Do not bring valuables to the hospital.  Contacts, dentures or bridgework may not be worn into surgery.  Leave suitcase in the car. After surgery it may be brought to your room.  For patients admitted to the hospital, checkout time is 11:00 AM the day of discharge.   Patients discharged the day of surgery will not be allowed to drive home.  Name and phone number of your driver: family Special Instructions: N/A   Please read over the following fact sheets that you were given: Pain Booklet, MRSA Information, Surgical Site Infection Prevention, Anesthesia Post-op Instructions and Care and Recovery After Surgery Colonoscopy A colonoscopy is an exam to evaluate your entire colon. In this exam, your colon is cleansed. A long fiberoptic tube is inserted through your rectum and into your colon. The fiberoptic scope (endoscope) is a long bundle of enclosed and very flexible fibers. These fibers transmit light to the area examined and send images from that area to your caregiver. Discomfort is usually minimal. You may be given a drug to help you sleep (sedative) during or prior to the procedure. This exam helps to detect lumps (tumors), polyps, inflammation, and areas of bleeding. Your caregiver may also take a small piece of tissue (biopsy) that will be examined under a microscope. LET YOUR CAREGIVER KNOW ABOUT:    Allergies to food or medicine.  Medicines taken, including vitamins, herbs, eyedrops, over-the-counter medicines, and creams.  Use of steroids (by mouth or creams).  Previous problems with anesthetics or numbing medicines.  History of bleeding problems or blood clots.  Previous surgery.  Other health problems, including diabetes and kidney problems.  Possibility of pregnancy, if this applies. BEFORE THE PROCEDURE   A clear liquid diet may be required for 2 days before the exam.  Ask your caregiver about changing or stopping your regular medications.  Liquid injections (enemas) or laxatives may be required.  A large amount of electrolyte solution may be given to you to drink over a short period of time. This solution is used to clean out your colon.  You should be present 60 minutes prior to your procedure or as directed by your caregiver. AFTER THE PROCEDURE   If you received a sedative or pain relieving medication, you will need to arrange for someone to drive you home.  Occasionally, there is a little blood passed with the first bowel movement. Do not be concerned. FINDING OUT THE RESULTS OF YOUR TEST Not all test results are available during your visit. If your test results are not back during the visit, make an appointment with your caregiver to find out the results. Do not assume everything is normal if you have not heard from your caregiver or the medical facility. It is important for you to follow up on all of  your test results. HOME CARE INSTRUCTIONS   It is not unusual to pass moderate amounts of gas and experience mild abdominal cramping following the procedure. This is due to air being used to inflate your colon during the exam. Walking or a warm pack on your belly (abdomen) may help.  You may resume all normal meals and activities after sedatives and medicines have worn off.  Only take over-the-counter or prescription medicines for pain, discomfort, or fever as  directed by your caregiver. Do not use aspirin or blood thinners if a biopsy was taken. Consult your caregiver for medicine usage if biopsies were taken. SEEK IMMEDIATE MEDICAL CARE IF:   You have a fever.  You pass large blood clots or fill a toilet with blood following the procedure. This may also occur 10 to 14 days following the procedure. This is more likely if a biopsy was taken.  You develop abdominal pain that keeps getting worse and cannot be relieved with medicine. Document Released: 04/01/2000 Document Revised: 06/27/2011 Document Reviewed: 11/15/2007 [facility] Patient Information 2013 [place], [place]. Esophagogastroduodenoscopy This is an endoscopic procedure (a procedure that uses a device like a flexible telescope) that allows your caregiver to view the upper stomach and small bowel. This test allows your caregiver to look at the esophagus. The esophagus carries food from your mouth to your stomach. They can also look at your duodenum. This is the first part of the small intestine that attaches to the stomach. This test is used to detect problems in the bowel such as ulcers and inflammation. PREPARATION FOR TEST Nothing to eat after midnight the day before the test. NORMAL FINDINGS Normal esophagus, stomach, and duodenum. Ranges for normal findings may vary among different laboratories and hospitals. You should always check with your doctor after having lab work or other tests done to discuss the meaning of your test results and whether your values are considered within normal limits. MEANING OF TEST  Your caregiver will go over the test results with you and discuss the importance and meaning of your results, as well as treatment options and the need for additional tests if necessary. OBTAINING THE TEST RESULTS It is your responsibility to obtain your test results. Ask the lab or department performing the test when and how you will get your results. Document Released: 08/05/2004  Document Revised: 06/27/2011 Document Reviewed: 03/14/2008 [facility] Patient Information 2013 [place], [place]. PATIENT INSTRUCTIONS POST-ANESTHESIA  IMMEDIATELY FOLLOWING SURGERY:  Do not drive or operate machinery for the first twenty four hours after surgery.  Do not make any important decisions for twenty four hours after surgery or while taking narcotic pain medications or sedatives.  If you develop intractable nausea and vomiting or a severe headache please notify your doctor immediately.  FOLLOW-UP:  Please make an appointment with your surgeon as instructed. You do not need to follow up with anesthesia unless specifically instructed to do so.  WOUND CARE INSTRUCTIONS (if applicable):  Keep a dry clean dressing on the anesthesia/puncture wound site if there is drainage.  Once the wound has quit draining you may leave it open to air.  Generally you should leave the bandage intact for twenty four hours unless there is drainage.  If the epidural site drains for more than 36-48 hours please call the anesthesia department.  QUESTIONS?:  Please feel free to call your physician or the hospital operator if you have any questions, and they will be happy to assist you.

## 2012-02-01 ENCOUNTER — Other Ambulatory Visit (HOSPITAL_COMMUNITY): Payer: Medicare Other

## 2012-02-03 ENCOUNTER — Telehealth: Payer: Self-pay | Admitting: Gastroenterology

## 2012-02-03 ENCOUNTER — Inpatient Hospital Stay (HOSPITAL_COMMUNITY): Admission: RE | Admit: 2012-02-03 | Payer: Medicare Other | Source: Ambulatory Visit

## 2012-02-03 NOTE — Telephone Encounter (Signed)
Pt has the flu and needs to cancel his tcs/egd on 10/22 with SF. He will [facility] in a week or so after he gets better

## 2012-02-07 ENCOUNTER — Ambulatory Visit (HOSPITAL_COMMUNITY): Admission: RE | Admit: 2012-02-07 | Payer: Medicare Other | Source: Ambulatory Visit | Admitting: Gastroenterology

## 2012-02-07 ENCOUNTER — Encounter (HOSPITAL_COMMUNITY): Admission: RE | Payer: Self-pay | Source: Ambulatory Visit

## 2012-02-07 SURGERY — COLONOSCOPY WITH PROPOFOL
Anesthesia: Monitor Anesthesia Care

## 2012-02-25 NOTE — Progress Notes (Signed)
REVIEWED.  PLEASE CALL PT.  LET HIM KNOW WE HOPE THAT HE IS FEELING BETTER. WE ARE AWARE HE CANCELLED  HIS PROCEDURES DOE THE THIRD TIME DUE TO FLU. IF HE RESCHEDULES AND CANCELS AGAIN, HE WILL BE DISCHARGED FROM THE PRACTICE.

## 2012-05-21 ENCOUNTER — Other Ambulatory Visit: Payer: Self-pay | Admitting: Gastroenterology

## 2012-05-22 NOTE — Telephone Encounter (Signed)
Pt needs appt prior to further RFs or can get from PCP.

## 2012-05-22 NOTE — Telephone Encounter (Signed)
Called pt. Many rings and no answer.  

## 2012-05-22 NOTE — Telephone Encounter (Signed)
Pt returned call and was informed. He said Dr. Juanetta Gosling gave him a months supply yesterday. He will schedule OV and Darl Pikes is scheduling that for him.

## 2012-05-25 ENCOUNTER — Other Ambulatory Visit (HOSPITAL_COMMUNITY): Payer: Self-pay | Admitting: Family Medicine

## 2012-05-25 DIAGNOSIS — R609 Edema, unspecified: Secondary | ICD-10-CM

## 2012-05-29 ENCOUNTER — Ambulatory Visit (HOSPITAL_COMMUNITY)
Admission: RE | Admit: 2012-05-29 | Discharge: 2012-05-29 | Disposition: A | Discharge status: Home / Self Care | Payer: Medicare Other | Source: Ambulatory Visit | Attending: Family Medicine | Admitting: Family Medicine

## 2012-05-29 DIAGNOSIS — M79609 Pain in unspecified limb: Secondary | ICD-10-CM | POA: Insufficient documentation

## 2012-05-29 DIAGNOSIS — M7989 Other specified soft tissue disorders: Secondary | ICD-10-CM | POA: Insufficient documentation

## 2012-05-29 DIAGNOSIS — R609 Edema, unspecified: Secondary | ICD-10-CM

## 2012-06-06 ENCOUNTER — Ambulatory Visit: Payer: Medicare Other | Admitting: Gastroenterology

## 2012-06-21 ENCOUNTER — Encounter: Payer: Self-pay | Admitting: Gastroenterology

## 2012-06-21 ENCOUNTER — Ambulatory Visit (INDEPENDENT_AMBULATORY_CARE_PROVIDER_SITE_OTHER): Payer: Medicare Other | Admitting: Gastroenterology

## 2012-06-21 VITALS — BP 152/98 | HR 106 | Temp 98.1°F | Ht 75.0 in | Wt 340.8 lb

## 2012-06-21 DIAGNOSIS — R197 Diarrhea, unspecified: Secondary | ICD-10-CM

## 2012-06-21 MED ORDER — DICYCLOMINE HCL 10 MG PO CAPS: 10.0000 mg | ORAL_CAPSULE | Freq: Three times a day (TID) | ORAL | Status: DC

## 2012-06-21 MED ORDER — ESOMEPRAZOLE MAGNESIUM 40 MG PO CPDR: 40.0000 mg | DELAYED_RELEASE_CAPSULE | Freq: Two times a day (BID) | ORAL | Status: DC
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
## 2012-06-21 NOTE — Patient Instructions (Addendum)
Continue to take Bentyl before meals and at bedtime. I have sent the refill to your pharmacy.   Take Nexium twice a day before breakfast and supper.  We have scheduled a colonoscopy with Dr. Darrick Penna in the near future.

## 2012-06-22 NOTE — Assessment & Plan Note (Signed)
Significant GERD, worsened by weight gain. He has continued to gain weight but notes vague dysphagia. He also notes intermittent LUQ pain. Continue Nexium BID. He is REFUSING an EGD. States his gag reflex is "bad".   Continue Nexium BID Aggressive weight loss, exercise, dietary modification.

## 2012-06-22 NOTE — Assessment & Plan Note (Signed)
32 year old male with history of chronic diarrhea, family history of colon cancer in his mom in her 57s. Low-volume hematochezia noted in remote past. Chronic abdominal pain noted as well. HE HAS CONTINUED TO GAIN WEIGHT despite his symptoms. Since March 2011, he has gained 81 pounds. 26 of those were since September 2013. I feel with his history of significant depression and anxiety, his symptoms may be exacerbated. Unlikely celiac disease, unable to rule out microscopic colitis. Continue Bentyl.   Proceed with colonoscopy with Dr. Darrick Penna in the near future. The risks, benefits, and alternatives have been discussed in detail with the patient. They state understanding and desire to proceed.  PROPOFOL DUE TO POLYPHARMACY

## 2012-06-25 NOTE — Progress Notes (Signed)
Faxed to PCP

## 2012-07-13 ENCOUNTER — Encounter (HOSPITAL_COMMUNITY): Payer: Self-pay | Admitting: Pharmacy Technician

## 2012-07-19 ENCOUNTER — Encounter (HOSPITAL_COMMUNITY): Admission: RE | Admit: 2012-07-19 | Payer: Medicare Other | Source: Ambulatory Visit

## 2012-07-20 NOTE — Patient Instructions (Addendum)
Allen Palmer  07/20/2012   Your procedure is scheduled on: 07/24/12  Report to [facility].  Call this number if you have problems the morning of surgery: [phone]   Remember:   Do not eat food or drink liquids after midnight.   Take these medicines the morning of surgery with A SIP OF WATER: hyzaar, albuterol, nexium   Do not wear jewelry, make-up or nail polish.  Do not wear lotions, powders, or perfumes. You may wear deodorant.  Do not shave 48 hours prior to surgery. Men may shave face and neck.  Do not bring valuables to the hospital.  Contacts, dentures or bridgework may not be worn into surgery.  Leave suitcase in the car. After surgery it may be brought to your room.  For patients admitted to the hospital, checkout time is 11:00 AM the day of  discharge.   Patients discharged the day of surgery will not be allowed to drive  home.  Name and phone number of your driver: family  Special Instructions: N/A   Please read over the following fact sheets that you were given: Anesthesia Post-op Instructions and Care and Recovery After Surgery   PATIENT INSTRUCTIONS POST-ANESTHESIA  IMMEDIATELY FOLLOWING SURGERY:  Do not drive or operate machinery for the first twenty four hours after surgery.  Do not make any important decisions for twenty four hours after surgery or while taking narcotic pain medications or sedatives.  If you develop intractable nausea and vomiting or a severe headache please notify your doctor immediately.  FOLLOW-UP:  Please make an appointment with your surgeon as instructed. You do not need to follow up with anesthesia unless specifically instructed to do so.  WOUND CARE INSTRUCTIONS (if applicable):  Keep a dry clean dressing on the anesthesia/puncture wound site if there is drainage.  Once the wound has quit draining you may leave it open to air.  Generally you should leave the bandage intact for twenty four hours unless there is drainage.  If the  epidural site drains for more than 36-48 hours please call the anesthesia department.  QUESTIONS?:  Please feel free to call your physician or the hospital operator if you have any questions, and they will be happy to assist you.      Colonoscopy A colonoscopy is an exam to evaluate your entire colon. In this exam, your colon is cleansed. A long fiberoptic tube is inserted through your rectum and into your colon. The fiberoptic scope (endoscope) is a long bundle of enclosed and very flexible fibers. These fibers transmit light to the area examined and send images from that area to your caregiver. Discomfort is usually minimal. You may be given a drug to help you sleep (sedative) during or prior to the procedure. This exam helps to detect lumps (tumors), polyps, inflammation, and areas of bleeding. Your caregiver may also take a small piece of tissue (biopsy) that will be examined under a microscope. LET YOUR CAREGIVER KNOW ABOUT:   Allergies to food or medicine.  Medicines taken, including vitamins, herbs, eyedrops, over-the-counter medicines, and creams.  Use of steroids (by mouth or creams).  Previous problems with anesthetics or numbing medicines.  History of bleeding problems or blood clots.  Previous surgery.  Other health problems, including diabetes and kidney problems.  Possibility of pregnancy, if this applies. BEFORE THE PROCEDURE   A clear liquid diet may be required for 2 days before the exam.  Ask your caregiver about changing or stopping your  regular medications.  Liquid injections (enemas) or laxatives may be required.  A large amount of electrolyte solution may be given to you to drink over a short period of time. This solution is used to clean out your colon.  You should be present 60 minutes prior to your procedure or as directed by your caregiver. AFTER THE PROCEDURE   If you received a sedative or pain relieving medication, you will need to arrange for someone  to drive you home.  Occasionally, there is a little blood passed with the first bowel movement. Do not be concerned. FINDING OUT THE RESULTS OF YOUR TEST Not all test results are available during your visit. If your test results are not back during the visit, make an appointment with your caregiver to find out the results. Do not assume everything is normal if you have not heard from your caregiver or the medical facility. It is important for you to follow up on all of your test results. HOME CARE INSTRUCTIONS   It is not unusual to pass moderate amounts of gas and experience mild abdominal cramping following the procedure. This is due to air being used to inflate your colon during the exam. Walking or a warm pack on your belly (abdomen) may help.  You may resume all normal meals and activities after sedatives and medicines have worn off.  Only take over-the-counter or prescription medicines for pain, discomfort, or fever as directed by your caregiver. Do not use aspirin or blood thinners if a biopsy was taken. Consult your caregiver for medicine usage if biopsies were taken. SEEK IMMEDIATE MEDICAL CARE IF:   You have a fever.  You pass large blood clots or fill a toilet with blood following the procedure. This may also occur 10 to 14 days following the procedure. This is more likely if a biopsy was taken.  You develop abdominal pain that keeps getting worse and cannot be relieved with medicine. Document Released: 04/01/2000 Document Revised: 06/27/2011 Document Reviewed: 11/15/2007 [facility] Patient Information 2013 [place], [place].

## 2012-07-23 ENCOUNTER — Telehealth: Payer: Self-pay | Admitting: Gastroenterology

## 2012-07-23 ENCOUNTER — Encounter (HOSPITAL_COMMUNITY)
Admission: RE | Admit: 2012-07-23 | Discharge: 2012-07-23 | Disposition: A | Discharge status: Home / Self Care | Payer: Medicare Other | Source: Ambulatory Visit | Attending: Family Medicine | Admitting: Family Medicine

## 2012-07-23 NOTE — Telephone Encounter (Signed)
Pt's mother called to [facility] patient's tcs that is scheduled for tomorrow with SF. He has some other health issues that he wants to take care of first. [phone]

## 2012-07-23 NOTE — Telephone Encounter (Signed)
Procedure cancelled patients mother stated that Allen Palmer needs to go see Dr. Sudie Bailey about the fluid in his feet and legs first before having procedure

## 2012-07-24 ENCOUNTER — Encounter (HOSPITAL_COMMUNITY): Admission: RE | Payer: Self-pay | Source: Ambulatory Visit

## 2012-07-24 ENCOUNTER — Ambulatory Visit (HOSPITAL_COMMUNITY): Admission: RE | Admit: 2012-07-24 | Payer: Medicare Other | Source: Ambulatory Visit | Admitting: Gastroenterology

## 2012-07-24 SURGERY — COLONOSCOPY WITH PROPOFOL
Anesthesia: Monitor Anesthesia Care

## 2012-10-06 NOTE — Progress Notes (Addendum)
REVIEWED.   APR 2014     Procedure cancelled patients mother stated that Allen Palmer needs to go see Dr. Sudie Bailey about the fluid in his feet and legs first before having procedure

## 2016-12-25 ENCOUNTER — Encounter (HOSPITAL_COMMUNITY): Payer: Self-pay | Admitting: Emergency Medicine

## 2016-12-25 ENCOUNTER — Emergency Department (HOSPITAL_COMMUNITY)
Admission: EM | Admit: 2016-12-25 | Discharge: 2016-12-25 | Disposition: A | Discharge status: Home / Self Care | Payer: Medicare Other | Attending: Emergency Medicine | Admitting: Emergency Medicine

## 2016-12-25 ENCOUNTER — Emergency Department (HOSPITAL_COMMUNITY): Payer: Medicare Other

## 2016-12-25 DIAGNOSIS — F17210 Nicotine dependence, cigarettes, uncomplicated: Secondary | ICD-10-CM | POA: Insufficient documentation

## 2016-12-25 DIAGNOSIS — Z79899 Other long term (current) drug therapy: Secondary | ICD-10-CM | POA: Insufficient documentation

## 2016-12-25 DIAGNOSIS — Z7984 Long term (current) use of oral hypoglycemic drugs: Secondary | ICD-10-CM | POA: Insufficient documentation

## 2016-12-25 DIAGNOSIS — E1165 Type 2 diabetes mellitus with hyperglycemia: Secondary | ICD-10-CM | POA: Diagnosis not present

## 2016-12-25 DIAGNOSIS — R079 Chest pain, unspecified: Secondary | ICD-10-CM | POA: Diagnosis present

## 2016-12-25 DIAGNOSIS — I10 Essential (primary) hypertension: Secondary | ICD-10-CM | POA: Diagnosis not present

## 2016-12-25 DIAGNOSIS — R0789 Other chest pain: Secondary | ICD-10-CM | POA: Insufficient documentation

## 2016-12-25 DIAGNOSIS — R739 Hyperglycemia, unspecified: Secondary | ICD-10-CM

## 2016-12-25 LAB — CBC
HCT: 45.7 % (ref 39.0–52.0)
Hemoglobin: 16 g/dL (ref 13.0–17.0)
MCH: 30.3 pg (ref 26.0–34.0)
MCHC: 35 g/dL (ref 30.0–36.0)
MCV: 86.6 fL (ref 78.0–100.0)
PLATELETS: 293 10*3/uL (ref 150–400)
RBC: 5.28 MIL/uL (ref 4.22–5.81)
RDW: 13 % (ref 11.5–15.5)
WBC: 18.2 10*3/uL — AB (ref 4.0–10.5)

## 2016-12-25 LAB — BASIC METABOLIC PANEL
Anion gap: 8 (ref 5–15)
BUN: 12 mg/dL (ref 6–20)
CALCIUM: 8.9 mg/dL (ref 8.9–10.3)
CHLORIDE: 104 mmol/L (ref 101–111)
CO2: 24 mmol/L (ref 22–32)
CREATININE: 0.65 mg/dL (ref 0.61–1.24)
GFR calc Af Amer: >60 mL/min (ref >60)
GFR calc non Af Amer: >60 mL/min (ref >60)
Glucose, Bld: 320 mg/dL — ABNORMAL HIGH (ref 65–99)
Potassium: 3.4 mmol/L — ABNORMAL LOW (ref 3.5–5.1)
SODIUM: 136 mmol/L (ref 135–145)

## 2016-12-25 LAB — TROPONIN I

## 2016-12-25 LAB — I-STAT TROPONIN, ED: TROPONIN I, POC: 0 ng/mL (ref 0.00–0.08)

## 2016-12-25 LAB — D-DIMER, QUANTITATIVE: D-Dimer, Quant: <0.27 ug/mL-FEU (ref 0.00–0.50)

## 2016-12-25 MED ORDER — ACETAMINOPHEN 500 MG PO TABS
1000.0000 mg | ORAL_TABLET | Freq: Once | ORAL | Status: AC
  Administered 2016-12-25: 1000 mg via ORAL
  Filled 2016-12-25: qty 2

## 2016-12-25 MED ORDER — GI COCKTAIL ~~LOC~~
30.0000 mL | Freq: Once | ORAL | Status: AC
  Administered 2016-12-25: 30 mL via ORAL
  Filled 2016-12-25: qty 30

## 2016-12-25 MED ORDER — LORAZEPAM 1 MG PO TABS
1.0000 mg | ORAL_TABLET | Freq: Once | ORAL | Status: AC
  Administered 2016-12-25: 1 mg via ORAL
  Filled 2016-12-25: qty 1

## 2016-12-25 NOTE — ED Notes (Signed)
Pt c/o headache- Dr Manus Gunningancour made aware- new orders received.

## 2016-12-25 NOTE — Discharge Instructions (Signed)
There is no evidence of heart attack or blood clot in the lung. Follow up with your doctor and cardiologist for a stress test. Return to the ED if you develop new or worsening symptoms.

## 2016-12-25 NOTE — ED Triage Notes (Signed)
Pt c/o central chest pain x one hour. Pt was given  of aspirin and 1 nitro enroute to facility.

## 2016-12-25 NOTE — ED Notes (Signed)
Patient transported to X-ray 
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
## 2017-03-23 ENCOUNTER — Ambulatory Visit (INDEPENDENT_AMBULATORY_CARE_PROVIDER_SITE_OTHER): Payer: Medicare Other | Admitting: General Surgery

## 2017-03-23 ENCOUNTER — Encounter: Payer: Self-pay | Admitting: General Surgery

## 2017-03-23 VITALS — BP 146/88 | HR 104 | Temp 98.9°F | Ht 75.0 in | Wt 273.0 lb

## 2017-03-23 DIAGNOSIS — K429 Umbilical hernia without obstruction or gangrene: Secondary | ICD-10-CM | POA: Diagnosis not present
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
## 2017-03-23 NOTE — Patient Instructions (Signed)
Allen Palmer  03/23/2017     @PREFPERIOPPHARMACY @   Your procedure is scheduled on  03/28/2017   Report to [facility] at  705   A.M.  Call this number if you have problems the morning of surgery:  [phone]   Remember:  Do not eat food or drink liquids after midnight.  Take these medicines the morning of surgery with A SIP OF WATER  Nexium, hydrocodone, losartan, prilosec. Use your inhaler before you come.   Do not wear jewelry, make-up or nail polish.  Do not wear lotions, powders, or perfumes, or deoderant.  Do not shave 48 hours prior to surgery.  Men may shave face and neck.  Do not bring valuables to the hospital.  [facility] is not responsible for any belongings or valuables.  Contacts, dentures or bridgework may not be worn into surgery.  Leave your suitcase in the car.  After surgery it may be brought to your room.  For patients admitted to the hospital, discharge time will be determined by your treatment team.  Patients discharged the day of surgery will not be allowed to drive home.   Name and phone number of your driver:   family Special instructions:  None  Please read over the following fact sheets that you were given. Anesthesia Post-op Instructions and Care and Recovery After Surgery       Open Hernia Repair, Adult Open hernia repair is a surgical procedure to fix a hernia. A hernia occurs when an internal organ or tissue pushes out through a weak spot in the abdominal wall muscles. Hernias commonly occur in the groin and around the navel. Most hernias tend to get worse over time. Often, surgery is done to prevent the hernia from becoming bigger, uncomfortable, or an emergency. Emergency surgery may be needed if abdominal contents get stuck in the opening (incarcerated hernia) or the blood supply gets cut off (strangulated hernia). In an open repair, an incision is made in the abdomen to perform the surgery. Tell a health care provider  about:  Any allergies you have.  All medicines you are taking, including vitamins, herbs, eye drops, creams, and over-the-counter medicines.  Any problems you or family members have had with anesthetic medicines.  Any blood or bone disorders you have.  Any surgeries you have had.  Any medical conditions you have, including any recent cold or flu symptoms.  Whether you are pregnant or may be pregnant. What are the risks? Generally, this is a safe procedure. However, problems may occur, including:  Long-lasting (chronic) pain.  Bleeding.  Infection.  Damage to the testicle. This can cause shrinking or swelling.  Damage to the bladder, blood vessels, intestine, or nerves near the hernia.  Trouble passing urine.  Allergic reactions to medicines.  Return of the hernia.  What happens before the procedure? Staying hydrated Follow instructions from your health care provider about hydration, which may include:  Up to 2 hours before the procedure - you may continue to drink clear liquids, such as water, clear fruit juice, black coffee, and plain tea.  Eating and drinking restrictions Follow instructions from your health care provider about eating and drinking, which may include:  8 hours before the procedure - stop eating heavy meals or foods such as meat, fried foods, or fatty foods.  6 hours before the procedure - stop eating light meals or foods, such as toast or cereal.  6 hours before  the procedure - stop drinking milk or drinks that contain milk.  2 hours before the procedure - stop drinking clear liquids.  Medicines  Ask your health care provider about: ? Changing or stopping your regular medicines. This is especially important if you are taking diabetes medicines or blood thinners. ? Taking medicines such as aspirin and ibuprofen. These medicines can thin your blood. Do not take these medicines before your procedure if your health care provider instructs you not  to.  You may be given antibiotic medicine to help prevent infection. General instructions  You may have blood tests or imaging studies.  Ask your health care provider how your surgical site will be marked or identified.  If you smoke, do not smoke for at least 2 weeks before your procedure or for as long as told by your health care provider.  Let your health care provider know if you develop a cold or any infection before your surgery.  Plan to have someone take you home from the hospital or clinic.  If you will be going home right after the procedure, plan to have someone with you for 24 hours. What happens during the procedure?  To reduce your risk of infection: ? Your health care team will wash or sanitize their hands. ? Your skin will be washed with soap. ? Hair may be removed from the surgical area.  An IV tube will be inserted into one of your veins.  You will be given one or more of the following: ? A medicine to help you relax (sedative). ? A medicine to numb the area (local anesthetic). ? A medicine to make you fall asleep (general anesthetic).  Your surgeon will make an incision over the hernia.  The tissues of the hernia will be moved back into place.  The edges of the hernia may be stitched together.  The opening in the abdominal muscles will be closed with stitches (sutures). Or, your surgeon will place a mesh patch made of manmade (synthetic) material over the opening.  The incision will be closed.  A bandage (dressing) may be placed over the incision. The procedure may vary among health care providers and hospitals. What happens after the procedure?  Your blood pressure, heart rate, breathing rate, and blood oxygen level will be monitored until the medicines you were given have worn off.  You may be given medicine for pain.  Do not drive for 24 hours if you received a sedative. This information is not intended to replace advice given to you by your  health care provider. Make sure you discuss any questions you have with your health care provider. Document Released: 09/28/2000 Document Revised: 10/23/2015 Document Reviewed: 09/16/2015 Elsevier Interactive Patient Education  2018 [employer]  Open Hernia Repair, Adult, Care After These instructions give you information about caring for yourself after your procedure. Your doctor may also give you more specific instructions. If you have problems or questions, contact your doctor. Follow these instructions at home: Surgical cut (incision) care   Follow instructions from your doctor about how to take care of your surgical cut area. Make sure you: ? Wash your hands with soap and water before you change your bandage (dressing). If you cannot use soap and water, use hand sanitizer. ? Change your bandage as told by your doctor. ? Leave stitches (sutures), skin glue, or skin tape (adhesive) strips in place. They may need to stay in place for 2 weeks or longer. If tape strips get loose and  curl up, you may trim the loose edges. Do not remove tape strips completely unless your doctor says it is okay.  Check your surgical cut every day for signs of infection. Check for: ? More redness, swelling, or pain. ? More fluid or blood. ? Warmth. ? Pus or a bad smell. Activity  Do not drive or use heavy machinery while taking prescription pain medicine. Do not drive until your doctor says it is okay.  Until your doctor says it is okay: ? Do not lift anything that is heavier than 10 lb (4.5 kg). ? Do not play contact sports.  Return to your normal activities as told by your doctor. Ask your doctor what activities are safe. General instructions  To prevent or treat having a hard time pooping (constipation) while you are taking prescription pain medicine, your doctor may recommend that you: ? Drink enough fluid to keep your pee (urine) clear or pale yellow. ? Take over-the-counter or prescription  medicines. ? Eat foods that are high in fiber, such as fresh fruits and vegetables, whole grains, and beans. ? Limit foods that are high in fat and processed sugars, such as fried and sweet foods.  Take over-the-counter and prescription medicines only as told by your doctor.  Do not take baths, swim, or use a hot tub until your doctor says it is okay.  Keep all follow-up visits as told by your doctor. This is important. Contact a doctor if:  You develop a rash.  You have more redness, swelling, or pain around your surgical cut.  You have more fluid or blood coming from your surgical cut.  Your surgical cut feels warm to the touch.  You have pus or a bad smell coming from your surgical cut.  You have a fever or chills.  You have blood in your poop (stool).  You have not pooped in 2-3 days.  Medicine does not help your pain. Get help right away if:  You have chest pain or you are short of breath.  You feel light-headed.  You feel weak and dizzy (feel faint).  You have very bad pain.  You throw up (vomit) and your pain is worse. This information is not intended to replace advice given to you by your health care provider. Make sure you discuss any questions you have with your health care provider. Document Released: 04/25/2014 Document Revised: 10/23/2015 Document Reviewed: 09/16/2015 Elsevier Interactive Patient Education  2017 [employer] Inc[employer]  General Anesthesia, Adult General anesthesia is the use of medicines to make a person "go to sleep" (be unconscious) for a medical procedure. General anesthesia is often recommended when a procedure:  Is long.  Requires you to be still or in an unusual position.  Is major and can cause you to lose blood.  Is impossible to do without general anesthesia.  The medicines used for general anesthesia are called general anesthetics. In addition to making you sleep, the medicines:  Prevent pain.  Control your blood  pressure.  Relax your muscles.  Tell a health care provider about:  Any allergies you have.  All medicines you are taking, including vitamins, herbs, eye drops, creams, and over-the-counter medicines.  Any problems you or family members have had with anesthetic medicines.  Types of anesthetics you have had in the past.  Any bleeding disorders you have.  Any surgeries you have had.  Any medical conditions you have.  Any history of heart or lung conditions, such as heart failure, sleep apnea, or chronic  obstructive pulmonary disease (COPD).  Whether you are pregnant or may be pregnant.  Whether you use tobacco, alcohol, marijuana, or street drugs.  Any history of Financial planner.  Any history of depression or anxiety. What are the risks? Generally, this is a safe procedure. However, problems may occur, including:  Allergic reaction to anesthetics.  Lung and heart problems.  Inhaling food or liquids from your stomach into your lungs (aspiration).  Injury to nerves.  Waking up during your procedure and being unable to move (rare).  Extreme agitation or a state of mental confusion (delirium) when you wake up from the anesthetic.  Air in the bloodstream, which can lead to stroke.  These problems are more likely to develop if you are having a major surgery or if you have an advanced medical condition. You can prevent some of these complications by answering all of your health care provider's questions thoroughly and by following all pre-procedure instructions. General anesthesia can cause side effects, including:  Nausea or vomiting  A sore throat from the breathing tube.  Feeling cold or shivery.  Feeling tired, washed out, or achy.  Sleepiness or drowsiness.  Confusion or agitation.  What happens before the procedure? Staying hydrated Follow instructions from your health care provider about hydration, which may include:  Up to 2 hours before the procedure  - you may continue to drink clear liquids, such as water, clear fruit juice, black coffee, and plain tea.  Eating and drinking restrictions Follow instructions from your health care provider about eating and drinking, which may include:  8 hours before the procedure - stop eating heavy meals or foods such as meat, fried foods, or fatty foods.  6 hours before the procedure - stop eating light meals or foods, such as toast or cereal.  6 hours before the procedure - stop drinking milk or drinks that contain milk.  2 hours before the procedure - stop drinking clear liquids.  Medicines  Ask your health care provider about: ? Changing or stopping your regular medicines. This is especially important if you are taking diabetes medicines or blood thinners. ? Taking medicines such as aspirin and ibuprofen. These medicines can thin your blood. Do not take these medicines before your procedure if your health care provider instructs you not to. ? Taking new dietary supplements or medicines. Do not take these during the week before your procedure unless your health care provider approves them.  If you are told to take a medicine or to continue taking a medicine on the day of the procedure, take the medicine with sips of water. General instructions   Ask if you will be going home the same day, the following day, or after a longer hospital stay. ? Plan to have someone take you home. ? Plan to have someone stay with you for the first 24 hours after you leave the hospital or clinic.  For 3-6 weeks before the procedure, try not to use any tobacco products, such as cigarettes, chewing tobacco, and e-cigarettes.  You may brush your teeth on the morning of the procedure, but make sure to spit out the toothpaste. What happens during the procedure?  You will be given anesthetics through a mask and through an IV tube in one of your veins.  You may receive medicine to help you relax (sedative).  As soon  as you are asleep, a breathing tube may be used to help you breathe.  An anesthesia specialist will stay with you throughout the procedure.  He or she will help keep you comfortable and safe by continuing to give you medicines and adjusting the amount of medicine that you get. He or she will also watch your blood pressure, pulse, and oxygen levels to make sure that the anesthetics do not cause any problems.  If a breathing tube was used to help you breathe, it will be removed before you wake up. The procedure may vary among health care providers and hospitals. What happens after the procedure?  You will wake up, often slowly, after the procedure is complete, usually in a recovery area.  Your blood pressure, heart rate, breathing rate, and blood oxygen level will be monitored until the medicines you were given have worn off.  You may be given medicine to help you calm down if you feel anxious or agitated.  If you will be going home the same day, your health care provider may check to make sure you can stand, drink, and urinate.  Your health care providers will treat your pain and side effects before you go home.  Do not drive for 24 hours if you received a sedative.  You may: ? Feel nauseous and vomit. ? Have a sore throat. ? Have mental slowness. ? Feel cold or shivery. ? Feel sleepy. ? Feel tired. ? Feel sore or achy, even in parts of your body where you did not have surgery. This information is not intended to replace advice given to you by your health care provider. Make sure you discuss any questions you have with your health care provider. Document Released: 07/12/2007 Document Revised: 09/15/2015 Document Reviewed: 03/19/2015 Elsevier Interactive Patient Education  2018 [employer] General Anesthesia, Adult, Care After These instructions provide you with information about caring for yourself after your procedure. Your health care provider may also give you more specific  instructions. Your treatment has been planned according to current medical practices, but problems sometimes occur. Call your health care provider if you have any problems or questions after your procedure. What can I expect after the procedure? After the procedure, it is common to have:  Vomiting.  A sore throat.  Mental slowness.  It is common to feel:  Nauseous.  Cold or shivery.  Sleepy.  Tired.  Sore or achy, even in parts of your body where you did not have surgery.  Follow these instructions at home: For at least 24 hours after the procedure:  Do not: ? Participate in activities where you could fall or become injured. ? Drive. ? Use heavy machinery. ? Drink alcohol. ? Take sleeping pills or medicines that cause drowsiness. ? Make important decisions or sign legal documents. ? Take care of children on your own.  Rest. Eating and drinking  If you vomit, drink water, juice, or soup when you can drink without vomiting.  Drink enough fluid to keep your urine clear or pale yellow.  Make sure you have little or no nausea before eating solid foods.  Follow the diet recommended by your health care provider. General instructions  Have a responsible adult stay with you until you are awake and alert.  Return to your normal activities as told by your health care provider. Ask your health care provider what activities are safe for you.  Take over-the-counter and prescription medicines only as told by your health care provider.  If you smoke, do not smoke without supervision.  Keep all follow-up visits as told by your health care provider. This is important. Contact a health care provider  if:  You continue to have nausea or vomiting at home, and medicines are not helpful.  You cannot drink fluids or start eating again.  You cannot urinate after 8-12 hours.  You develop a skin rash.  You have fever.  You have increasing redness at the site of your  procedure. Get help right away if:  You have difficulty breathing.  You have chest pain.  You have unexpected bleeding.  You feel that you are having a life-threatening or urgent problem. This information is not intended to replace advice given to you by your health care provider. Make sure you discuss any questions you have with your health care provider. Document Released: 07/11/2000 Document Revised: 09/07/2015 Document Reviewed: 03/19/2015 Elsevier Interactive Patient Education  [employer].
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
## 2017-03-23 NOTE — Patient Instructions (Signed)
Umbilical Hernia, Adult A hernia is a bulge of tissue that pushes through an opening between muscles. An umbilical hernia happens in the abdomen, near the belly button (umbilicus). The hernia may contain tissues from the small intestine, large intestine, or fatty tissue covering the intestines (omentum). Umbilical hernias in adults tend to get worse over time, and they require surgical treatment. There are several types of umbilical hernias. You may have:  A hernia located just above or below the umbilicus (indirect hernia). This is the most common type of umbilical hernia in adults.  A hernia that forms through an opening formed by the umbilicus (direct hernia).  A hernia that comes and goes (reducible hernia). A reducible hernia may be visible only when you strain, lift something heavy, or cough. This type of hernia can be pushed back into the abdomen (reduced).  A hernia that traps abdominal tissue inside the hernia (incarcerated hernia). This type of hernia cannot be reduced.  A hernia that cuts off blood flow to the tissues inside the hernia (strangulated hernia). The tissues can start to die if this happens. This type of hernia requires emergency treatment.  What are the causes? An umbilical hernia happens when tissue inside the abdomen presses on a weak area of the abdominal muscles. What increases the risk? You may have a greater risk of this condition if you:  Are obese.  Have had several pregnancies.  Have a buildup of fluid inside your abdomen (ascites).  Have had surgery that weakens the abdominal muscles.  What are the signs or symptoms? The main symptom of this condition is a painless bulge at or near the belly button. A reducible hernia may be visible only when you strain, lift something heavy, or cough. Other symptoms may include:  Dull pain.  A feeling of pressure.  Symptoms of a strangulated hernia may include:  Pain that gets increasingly worse.  Nausea and  vomiting.  Pain when pressing on the hernia.  Skin over the hernia becoming red or purple.  Constipation.  Blood in the stool.  How is this diagnosed? This condition may be diagnosed based on:  A physical exam. You may be asked to cough or strain while standing. These actions increase the pressure inside your abdomen and force the hernia through the opening in your muscles. Your health care provider may try to reduce the hernia by pressing on it.  Your symptoms and medical history.  How is this treated? Surgery is the only treatment for an umbilical hernia. Surgery for a strangulated hernia is done as soon as possible. If you have a small hernia that is not incarcerated, you may need to lose weight before having surgery. Follow these instructions at home:  Lose weight, if told by your health care provider.  Do not try to push the hernia back in.  Watch your hernia for any changes in color or size. Tell your health care provider if any changes occur.  You may need to avoid activities that increase pressure on your hernia.  Do not lift anything that is heavier than 10 lb (4.5 kg) until your health care provider says that this is safe.  Take over-the-counter and prescription medicines only as told by your health care provider.  Keep all follow-up visits as told by your health care provider. This is important. Contact a health care provider if:  Your hernia gets larger.  Your hernia becomes painful. Get help right away if:  You develop sudden, severe pain near the   area of your hernia.  You have pain as well as nausea or vomiting.  You have pain and the skin over your hernia changes color.  You develop a fever. This information is not intended to replace advice given to you by your health care provider. Make sure you discuss any questions you have with your health care provider. Document Released: 09/04/2015 Document Revised: 12/06/2015 Document Reviewed:  09/04/2015 Elsevier Interactive Patient Education  2018 Elsevier Inc.  

## 2017-03-24 ENCOUNTER — Other Ambulatory Visit: Payer: Self-pay

## 2017-03-24 ENCOUNTER — Encounter (HOSPITAL_COMMUNITY): Payer: Self-pay

## 2017-03-24 ENCOUNTER — Encounter (HOSPITAL_COMMUNITY)
Admission: RE | Admit: 2017-03-24 | Discharge: 2017-03-24 | Disposition: A | Discharge status: Home / Self Care | Payer: Medicare Other | Source: Ambulatory Visit | Attending: General Surgery | Admitting: General Surgery

## 2017-03-24 DIAGNOSIS — E669 Obesity, unspecified: Secondary | ICD-10-CM | POA: Diagnosis not present

## 2017-03-24 DIAGNOSIS — K219 Gastro-esophageal reflux disease without esophagitis: Secondary | ICD-10-CM | POA: Insufficient documentation

## 2017-03-24 DIAGNOSIS — I10 Essential (primary) hypertension: Secondary | ICD-10-CM | POA: Diagnosis not present

## 2017-03-24 DIAGNOSIS — Z01812 Encounter for preprocedural laboratory examination: Secondary | ICD-10-CM | POA: Diagnosis not present

## 2017-03-24 DIAGNOSIS — E119 Type 2 diabetes mellitus without complications: Secondary | ICD-10-CM | POA: Diagnosis not present

## 2017-03-24 HISTORY — DX: Polyneuropathy, unspecified: G62.9

## 2017-03-24 HISTORY — DX: Type 2 diabetes mellitus without complications: E11.9

## 2017-03-24 HISTORY — DX: Unspecified asthma, uncomplicated: J45.909

## 2017-03-24 LAB — CBC WITH DIFFERENTIAL/PLATELET
Basophils Absolute: 0.1 10*3/uL (ref 0.0–0.1)
Basophils Relative: 1 %
EOS ABS: 0.9 10*3/uL — AB (ref 0.0–0.7)
EOS PCT: 6 %
HCT: 46.1 % (ref 39.0–52.0)
Hemoglobin: 15 g/dL (ref 13.0–17.0)
LYMPHS ABS: 3.6 10*3/uL (ref 0.7–4.0)
Lymphocytes Relative: 24 %
MCH: 29.2 pg (ref 26.0–34.0)
MCHC: 32.5 g/dL (ref 30.0–36.0)
MCV: 89.7 fL (ref 78.0–100.0)
MONO ABS: 1.2 10*3/uL — AB (ref 0.1–1.0)
MONOS PCT: 8 %
Neutro Abs: 9 10*3/uL — ABNORMAL HIGH (ref 1.7–7.7)
Neutrophils Relative %: 61 %
PLATELETS: 290 10*3/uL (ref 150–400)
RBC: 5.14 MIL/uL (ref 4.22–5.81)
RDW: 13.1 % (ref 11.5–15.5)
WBC: 14.8 10*3/uL — AB (ref 4.0–10.5)

## 2017-03-24 LAB — BASIC METABOLIC PANEL
Anion gap: 7 (ref 5–15)
BUN: 9 mg/dL (ref 6–20)
CHLORIDE: 103 mmol/L (ref 101–111)
CO2: 25 mmol/L (ref 22–32)
CREATININE: 0.78 mg/dL (ref 0.61–1.24)
Calcium: 8.8 mg/dL — ABNORMAL LOW (ref 8.9–10.3)
GFR calc Af Amer: >60 mL/min (ref >60)
GFR calc non Af Amer: >60 mL/min (ref >60)
GLUCOSE: 182 mg/dL — AB (ref 65–99)
Potassium: 3.9 mmol/L (ref 3.5–5.1)
SODIUM: 135 mmol/L (ref 135–145)

## 2017-03-24 LAB — GLUCOSE, CAPILLARY: Glucose-Capillary: 177 mg/dL — ABNORMAL HIGH (ref 65–99)

## 2017-03-24 LAB — HEMOGLOBIN A1C
HEMOGLOBIN A1C: 8.3 % — AB (ref 4.8–5.6)
Mean Plasma Glucose: 191.51 mg/dL

## 2017-03-24 NOTE — Progress Notes (Signed)
   03/24/17 0931  OBSTRUCTIVE SLEEP APNEA  Have you ever been diagnosed with sleep apnea through a sleep study? No  Do you snore loudly (loud enough to be heard through closed doors)?  1  Do you often feel tired, fatigued, or sleepy during the daytime (such as falling asleep during driving or talking to someone)? 1  Has anyone observed you stop breathing during your sleep? 0  Do you have, or are you being treated for high blood pressure? 1  BMI more than 35 kg/m2? 0  Age > 50 (1-yes) 0  Neck circumference greater than:Male 16 inches or larger, Male 17inches or larger? 1  Male Gender (Yes=1) 1  Obstructive Sleep Apnea Score 5  Score 5 or greater  Results sent to PCP

## 2017-03-27 NOTE — Pre-Procedure Instructions (Signed)
HbgA1C routed to PCP. 

## 2017-03-31 ENCOUNTER — Encounter (HOSPITAL_COMMUNITY): Payer: Self-pay

## 2017-03-31 ENCOUNTER — Encounter (HOSPITAL_COMMUNITY)
Admission: RE | Admit: 2017-03-31 | Discharge: 2017-03-31 | Disposition: A | Discharge status: Home / Self Care | Payer: Medicare Other | Source: Ambulatory Visit | Attending: General Surgery | Admitting: General Surgery

## 2017-04-03 ENCOUNTER — Ambulatory Visit (HOSPITAL_COMMUNITY)
Admission: RE | Admit: 2017-04-03 | Discharge: 2017-04-03 | Disposition: A | Discharge status: Home / Self Care | Payer: Medicare Other | Source: Ambulatory Visit | Attending: General Surgery | Admitting: General Surgery

## 2017-04-03 ENCOUNTER — Ambulatory Visit (HOSPITAL_COMMUNITY): Payer: Medicare Other | Admitting: Anesthesiology

## 2017-04-03 ENCOUNTER — Encounter (HOSPITAL_COMMUNITY)
Admission: RE | Disposition: A | Discharge status: Home / Self Care | Payer: Self-pay | Source: Ambulatory Visit | Attending: General Surgery

## 2017-04-03 ENCOUNTER — Encounter (HOSPITAL_COMMUNITY): Payer: Self-pay | Admitting: *Deleted

## 2017-04-03 DIAGNOSIS — K429 Umbilical hernia without obstruction or gangrene: Secondary | ICD-10-CM

## 2017-04-03 DIAGNOSIS — F329 Major depressive disorder, single episode, unspecified: Secondary | ICD-10-CM | POA: Insufficient documentation

## 2017-04-03 DIAGNOSIS — Z880 Allergy status to penicillin: Secondary | ICD-10-CM | POA: Insufficient documentation

## 2017-04-03 DIAGNOSIS — E118 Type 2 diabetes mellitus with unspecified complications: Secondary | ICD-10-CM | POA: Insufficient documentation

## 2017-04-03 DIAGNOSIS — K219 Gastro-esophageal reflux disease without esophagitis: Secondary | ICD-10-CM | POA: Diagnosis not present

## 2017-04-03 DIAGNOSIS — E669 Obesity, unspecified: Secondary | ICD-10-CM | POA: Insufficient documentation

## 2017-04-03 DIAGNOSIS — Z7951 Long term (current) use of inhaled steroids: Secondary | ICD-10-CM | POA: Insufficient documentation

## 2017-04-03 DIAGNOSIS — J45909 Unspecified asthma, uncomplicated: Secondary | ICD-10-CM | POA: Diagnosis not present

## 2017-04-03 DIAGNOSIS — F17210 Nicotine dependence, cigarettes, uncomplicated: Secondary | ICD-10-CM | POA: Insufficient documentation

## 2017-04-03 DIAGNOSIS — Z91030 Bee allergy status: Secondary | ICD-10-CM | POA: Diagnosis not present

## 2017-04-03 DIAGNOSIS — Z79899 Other long term (current) drug therapy: Secondary | ICD-10-CM | POA: Diagnosis not present

## 2017-04-03 DIAGNOSIS — K589 Irritable bowel syndrome without diarrhea: Secondary | ICD-10-CM | POA: Insufficient documentation

## 2017-04-03 DIAGNOSIS — I10 Essential (primary) hypertension: Secondary | ICD-10-CM | POA: Insufficient documentation

## 2017-04-03 DIAGNOSIS — Z6834 Body mass index (BMI) 34.0-34.9, adult: Secondary | ICD-10-CM | POA: Insufficient documentation

## 2017-04-03 DIAGNOSIS — Z91013 Allergy to seafood: Secondary | ICD-10-CM | POA: Diagnosis not present

## 2017-04-03 HISTORY — PX: UMBILICAL HERNIA REPAIR: SHX196

## 2017-04-03 LAB — GLUCOSE, CAPILLARY
Glucose-Capillary: 149 mg/dL — ABNORMAL HIGH (ref 65–99)
Glucose-Capillary: 144 mg/dL — ABNORMAL HIGH (ref 65–99)

## 2017-04-03 SURGERY — REPAIR, HERNIA, UMBILICAL, ADULT
Anesthesia: General | Site: Abdomen

## 2017-04-03 MED ORDER — ROCURONIUM BROMIDE 100 MG/10ML IV SOLN
INTRAVENOUS | Status: DC | PRN
  Administered 2017-04-03: 30 mg via INTRAVENOUS

## 2017-04-03 MED ORDER — PROPOFOL 10 MG/ML IV BOLUS
INTRAVENOUS | Status: AC
  Filled 2017-04-03: qty 20

## 2017-04-03 MED ORDER — CHLORHEXIDINE GLUCONATE CLOTH 2 % EX PADS: 6.0000 | MEDICATED_PAD | Freq: Once | CUTANEOUS | Status: DC

## 2017-04-03 MED ORDER — NEOSTIGMINE METHYLSULFATE 10 MG/10ML IV SOLN
INTRAVENOUS | Status: DC | PRN
  Administered 2017-04-03: 3 mg via INTRAVENOUS

## 2017-04-03 MED ORDER — HYDROCODONE-ACETAMINOPHEN 10-325 MG PO TABS: 1.0000 | ORAL_TABLET | Freq: Four times a day (QID) | ORAL | 0 refills | Status: AC | PRN

## 2017-04-03 MED ORDER — LIDOCAINE HCL (CARDIAC) 20 MG/ML IV SOLN
INTRAVENOUS | Status: DC | PRN
  Administered 2017-04-03: 50 mg via INTRAVENOUS

## 2017-04-03 MED ORDER — SUCCINYLCHOLINE CHLORIDE 20 MG/ML IJ SOLN
INTRAMUSCULAR | Status: AC
  Filled 2017-04-03: qty 1

## 2017-04-03 MED ORDER — KETOROLAC TROMETHAMINE 30 MG/ML IJ SOLN
INTRAMUSCULAR | Status: AC
  Filled 2017-04-03: qty 1

## 2017-04-03 MED ORDER — LACTATED RINGERS IV SOLN
INTRAVENOUS | Status: DC
  Administered 2017-04-03 (×2): via INTRAVENOUS

## 2017-04-03 MED ORDER — MIDAZOLAM HCL 2 MG/2ML IJ SOLN
INTRAMUSCULAR | Status: AC
  Filled 2017-04-03: qty 2

## 2017-04-03 MED ORDER — ONDANSETRON HCL 4 MG/2ML IJ SOLN
INTRAMUSCULAR | Status: AC
  Filled 2017-04-03: qty 2

## 2017-04-03 MED ORDER — FENTANYL CITRATE (PF) 100 MCG/2ML IJ SOLN
INTRAMUSCULAR | Status: DC | PRN
  Administered 2017-04-03 (×5): 50 ug via INTRAVENOUS

## 2017-04-03 MED ORDER — NEOSTIGMINE METHYLSULFATE 10 MG/10ML IV SOLN
INTRAVENOUS | Status: AC
  Filled 2017-04-03: qty 1

## 2017-04-03 MED ORDER — BUPIVACAINE-EPINEPHRINE (PF) 0.5% -1:200000 IJ SOLN
INTRAMUSCULAR | Status: AC
  Filled 2017-04-03: qty 30

## 2017-04-03 MED ORDER — MIDAZOLAM HCL 2 MG/2ML IJ SOLN
1.0000 mg | INTRAMUSCULAR | Status: AC
Start: 2017-04-03 — End: 2017-04-03
  Administered 2017-04-03: 2 mg via INTRAVENOUS

## 2017-04-03 MED ORDER — ONDANSETRON HCL 4 MG/2ML IJ SOLN
4.0000 mg | Freq: Once | INTRAMUSCULAR | Status: AC
  Administered 2017-04-03: 4 mg via INTRAVENOUS

## 2017-04-03 MED ORDER — SODIUM CHLORIDE 0.9 % IJ SOLN
INTRAMUSCULAR | Status: AC
  Filled 2017-04-03: qty 10

## 2017-04-03 MED ORDER — IPRATROPIUM-ALBUTEROL 0.5-2.5 (3) MG/3ML IN SOLN
3.0000 mL | Freq: Once | RESPIRATORY_TRACT | Status: AC
  Administered 2017-04-03: 3 mL via RESPIRATORY_TRACT

## 2017-04-03 MED ORDER — BUPIVACAINE HCL (PF) 0.5 % IJ SOLN
INTRAMUSCULAR | Status: AC
  Filled 2017-04-03: qty 30

## 2017-04-03 MED ORDER — SUCCINYLCHOLINE CHLORIDE 20 MG/ML IJ SOLN
INTRAMUSCULAR | Status: DC | PRN
  Administered 2017-04-03: 180 mg via INTRAVENOUS
  Administered 2017-04-03: 60 mg via INTRAVENOUS

## 2017-04-03 MED ORDER — VANCOMYCIN HCL IN DEXTROSE 1-5 GM/200ML-% IV SOLN
1000.0000 mg | INTRAVENOUS | Status: AC
  Administered 2017-04-03: 1000 mg via INTRAVENOUS
  Filled 2017-04-03: qty 200

## 2017-04-03 MED ORDER — FENTANYL CITRATE (PF) 250 MCG/5ML IJ SOLN
INTRAMUSCULAR | Status: AC
  Filled 2017-04-03: qty 5

## 2017-04-03 MED ORDER — DEXTROSE 5 % IV SOLN
INTRAVENOUS | Status: DC | PRN
  Administered 2017-04-03: 12:00:00 via INTRAVENOUS

## 2017-04-03 MED ORDER — BUPIVACAINE HCL (PF) 0.5 % IJ SOLN
INTRAMUSCULAR | Status: DC | PRN
  Administered 2017-04-03: 10 mL

## 2017-04-03 MED ORDER — GLYCOPYRROLATE 0.2 MG/ML IJ SOLN
INTRAMUSCULAR | Status: AC
  Filled 2017-04-03: qty 2

## 2017-04-03 MED ORDER — GLYCOPYRROLATE 0.2 MG/ML IJ SOLN
INTRAMUSCULAR | Status: DC | PRN
  Administered 2017-04-03: 0.4 mg via INTRAVENOUS

## 2017-04-03 MED ORDER — LIDOCAINE HCL (PF) 1 % IJ SOLN
INTRAMUSCULAR | Status: AC
  Filled 2017-04-03: qty 15

## 2017-04-03 MED ORDER — KETOROLAC TROMETHAMINE 30 MG/ML IJ SOLN
30.0000 mg | Freq: Once | INTRAMUSCULAR | Status: AC
  Administered 2017-04-03: 30 mg via INTRAVENOUS

## 2017-04-03 MED ORDER — ROCURONIUM BROMIDE 50 MG/5ML IV SOLN
INTRAVENOUS | Status: AC
  Filled 2017-04-03: qty 1

## 2017-04-03 MED ORDER — FENTANYL CITRATE (PF) 100 MCG/2ML IJ SOLN
25.0000 ug | INTRAMUSCULAR | Status: DC | PRN
  Administered 2017-04-03 (×2): 50 ug via INTRAVENOUS
  Filled 2017-04-03: qty 2

## 2017-04-03 MED ORDER — 0.9 % SODIUM CHLORIDE (POUR BTL) OPTIME
TOPICAL | Status: DC | PRN
  Administered 2017-04-03: 1000 mL

## 2017-04-03 MED ORDER — PROPOFOL 10 MG/ML IV BOLUS
INTRAVENOUS | Status: DC | PRN
  Administered 2017-04-03: 50 mg via INTRAVENOUS
  Administered 2017-04-03: 200 mg via INTRAVENOUS

## 2017-04-03 MED ORDER — IPRATROPIUM-ALBUTEROL 0.5-2.5 (3) MG/3ML IN SOLN
RESPIRATORY_TRACT | Status: AC
  Filled 2017-04-03: qty 3

## 2017-04-03 SURGICAL SUPPLY — 37 items
BAG HAMPER (MISCELLANEOUS) ×3 IMPLANT
BLADE SURG SZ11 CARB STEEL (BLADE) ×3 IMPLANT
CHLORAPREP W/TINT 26ML (MISCELLANEOUS) ×3 IMPLANT
CLOTH BEACON ORANGE TIMEOUT ST (SAFETY) ×3 IMPLANT
COVER LIGHT HANDLE STERIS (MISCELLANEOUS) ×6 IMPLANT
DECANTER SPIKE VIAL GLASS SM (MISCELLANEOUS) ×3 IMPLANT
ELECT REM PT RETURN 9FT ADLT (ELECTROSURGICAL) ×3
ELECTRODE REM PT RTRN 9FT ADLT (ELECTROSURGICAL) ×1 IMPLANT
GAUZE SPONGE 4X4 12PLY STRL (GAUZE/BANDAGES/DRESSINGS) ×3 IMPLANT
GLOVE BIOGEL PI IND STRL 6.5 (GLOVE) ×1 IMPLANT
GLOVE BIOGEL PI IND STRL 7.0 (GLOVE) ×2 IMPLANT
GLOVE BIOGEL PI INDICATOR 6.5 (GLOVE) ×2
GLOVE BIOGEL PI INDICATOR 7.0 (GLOVE) ×4
GLOVE ECLIPSE 6.5 STRL STRAW (GLOVE) ×6 IMPLANT
GLOVE SURG SS PI 7.5 STRL IVOR (GLOVE) ×3 IMPLANT
GOWN STRL REUS W/ TWL LRG LVL3 (GOWN DISPOSABLE) ×1 IMPLANT
GOWN STRL REUS W/TWL LRG LVL3 (GOWN DISPOSABLE) ×8 IMPLANT
INST SET MINOR GENERAL (KITS) ×3 IMPLANT
KIT ROOM TURNOVER APOR (KITS) ×3 IMPLANT
LIGASURE IMPACT 36 18CM CVD LR (INSTRUMENTS) ×3 IMPLANT
MANIFOLD NEPTUNE II (INSTRUMENTS) ×3 IMPLANT
MESH VENTRALEX ST 2.5 CRC MED (Mesh General) ×3 IMPLANT
NEEDLE HYPO 25X1 1.5 SAFETY (NEEDLE) ×3 IMPLANT
NS IRRIG 1000ML POUR BTL (IV SOLUTION) ×3 IMPLANT
PACK MINOR (CUSTOM PROCEDURE TRAY) ×3 IMPLANT
PAD ARMBOARD 7.5X6 YLW CONV (MISCELLANEOUS) ×3 IMPLANT
SET BASIN LINEN APH (SET/KITS/TRAYS/PACK) ×3 IMPLANT
SPONGE GAUZE 2X2 8PLY STER LF (GAUZE/BANDAGES/DRESSINGS)
SPONGE GAUZE 2X2 8PLY STRL LF (GAUZE/BANDAGES/DRESSINGS) IMPLANT
STAPLER VISISTAT (STAPLE) ×3 IMPLANT
SUT ETHIBOND NAB MO 7 #0 18IN (SUTURE) ×3 IMPLANT
SUT MNCRL AB 4-0 PS2 18 (SUTURE) IMPLANT
SUT VIC AB 2-0 CT2 27 (SUTURE) ×3 IMPLANT
SUT VIC AB 3-0 SH 27 (SUTURE) ×2
SUT VIC AB 3-0 SH 27X BRD (SUTURE) ×1 IMPLANT
SYR CONTROL 10ML LL (SYRINGE) ×3 IMPLANT
TAPE CLOTH SURG 4X10 WHT LF (GAUZE/BANDAGES/DRESSINGS) ×3 IMPLANT

## 2017-04-03 NOTE — Anesthesia Preprocedure Evaluation (Signed)
Anesthesia Evaluation  Patient identified by MRN, date of birth, ID band Patient awake    Reviewed: Allergy & Precautions, NPO status , Patient's Chart, lab work & pertinent test results  Airway Mallampati: II  TM Distance: >3 FB Neck ROM: Full    Dental  (+) Poor Dentition, Missing, Chipped, Dental Advisory Given   Pulmonary asthma , Current Smoker,    breath sounds clear to auscultation       Cardiovascular hypertension,  Rhythm:Regular Rate:Normal     Neuro/Psych    GI/Hepatic   Endo/Other  diabetes, Type 2  Renal/GU      Musculoskeletal   Abdominal   Peds  Hematology   Anesthesia Other Findings   Reproductive/Obstetrics                             Anesthesia Physical Anesthesia Plan  ASA: III  Anesthesia Plan: General   Post-op Pain Management:    Induction: Intravenous, Rapid sequence and Cricoid pressure planned  PONV Risk Score and Plan:   Airway Management Planned: Oral ETT  Additional Equipment:   Intra-op Plan:   Post-operative Plan: Extubation in OR  Informed Consent: I have reviewed the patients History and Physical, chart, labs and discussed the procedure including the risks, benefits and alternatives for the proposed anesthesia with the patient or authorized representative who has indicated his/her understanding and acceptance.     Plan Discussed with:   Anesthesia Plan Comments:         Anesthesia Quick Evaluation

## 2017-04-03 NOTE — Discharge Instructions (Signed)
Open Hernia Repair, Adult, Care After °These instructions give you information about caring for yourself after your procedure. Your doctor may also give you more specific instructions. If you have problems or questions, contact your doctor. °Follow these instructions at home: °Surgical cut (incision) care  ° °· Follow instructions from your doctor about how to take care of your surgical cut area. Make sure you: °¨ Wash your hands with soap and water before you change your bandage (dressing). If you cannot use soap and water, use hand sanitizer. °¨ Change your bandage as told by your doctor. °¨ Leave stitches (sutures), skin glue, or skin tape (adhesive) strips in place. They may need to stay in place for 2 weeks or longer. If tape strips get loose and curl up, you may trim the loose edges. Do not remove tape strips completely unless your doctor says it is okay. °· Check your surgical cut every day for signs of infection. Check for: °¨ More redness, swelling, or pain. °¨ More fluid or blood. °¨ Warmth. °¨ Pus or a bad smell. °Activity  °· Do not drive or use heavy machinery while taking prescription pain medicine. Do not drive until your doctor says it is okay. °· Until your doctor says it is okay: °¨ Do not lift anything that is heavier than 10 lb (4.5 kg). °¨ Do not play contact sports. °· Return to your normal activities as told by your doctor. Ask your doctor what activities are safe. °General instructions  °· To prevent or treat having a hard time pooping (constipation) while you are taking prescription pain medicine, your doctor may recommend that you: °¨ Drink enough fluid to keep your pee (urine) clear or pale yellow. °¨ Take over-the-counter or prescription medicines. °¨ Eat foods that are high in fiber, such as fresh fruits and vegetables, whole grains, and beans. °¨ Limit foods that are high in fat and processed sugars, such as fried and sweet foods. °· Take over-the-counter and prescription medicines only  as told by your doctor. °· Do not take baths, swim, or use a hot tub until your doctor says it is okay. °· Keep all follow-up visits as told by your doctor. This is important. °Contact a doctor if: °· You develop a rash. °· You have more redness, swelling, or pain around your surgical cut. °· You have more fluid or blood coming from your surgical cut. °· Your surgical cut feels warm to the touch. °· You have pus or a bad smell coming from your surgical cut. °· You have a fever or chills. °· You have blood in your poop (stool). °· You have not pooped in 2-3 days. °· Medicine does not help your pain. °Get help right away if: °· You have chest pain or you are short of breath. °· You feel light-headed. °· You feel weak and dizzy (feel faint). °· You have very bad pain. °· You throw up (vomit) and your pain is worse. °This information is not intended to replace advice given to you by your health care provider. Make sure you discuss any questions you have with your health care provider. °Document Released: 04/25/2014 Document Revised: 10/23/2015 Document Reviewed: 09/16/2015 °Elsevier Interactive Patient Education © 2017 Elsevier Inc. ° °

## 2017-04-03 NOTE — Interval H&P Note (Signed)
History and Physical Interval Note:  04/03/2017 11:07 AM  Allen Palmer  has presented today for surgery, with the diagnosis of umbilical hernia  The various methods of treatment have been discussed with the patient and family. After consideration of risks, benefits and other options for treatment, the patient has consented to  Procedure(s): HERNIA REPAIR UMBILICAL ADULT WITH MESH (N/A) as a surgical intervention .  The patient's history has been reviewed, patient examined, no change in status, stable for surgery.  I have reviewed the patient's chart and labs.  Questions were answered to the patient's satisfaction.     Franky MachoMark Hiilei Gerst

## 2017-04-03 NOTE — Anesthesia Procedure Notes (Signed)
Procedure Name: Intubation Date/Time: 04/03/2017 12:00 PM Performed by: Pernell DupreAdams, Analiz Tvedt A, CRNA Pre-anesthesia Checklist: Patient identified, Timeout performed, Suction available, Emergency Drugs available and Patient being monitored Patient Re-evaluated:Patient Re-evaluated prior to induction Oxygen Delivery Method: Circle System Utilized Preoxygenation: Pre-oxygenation with 100% oxygen Induction Type: IV induction Ventilation: Mask ventilation without difficulty Laryngoscope Size: Miller and 2 Grade View: Grade III Tube type: Oral Tube size: 7.0 mm Number of attempts: 2 Airway Equipment and Method: Stylet and Video-laryngoscopy Placement Confirmation: ETT inserted through vocal cords under direct vision,  positive ETCO2 and breath sounds checked- equal and bilateral Secured at: 23 cm Tube secured with: Tape Dental Injury: Teeth and Oropharynx as per pre-operative assessment  Comments: DL x1 with Miller 3, grade 3 view by Darlis Wragg,CRNA; DL x1 with glidescope #4 LoPro Dr. Myrtis HoppingGonzalez AOI; easy mask airway; VSS throughout ;

## 2017-04-03 NOTE — Anesthesia Postprocedure Evaluation (Signed)
Anesthesia Post Note  Patient: Allen Palmer  Procedure(s) Performed: HERNIA REPAIR UMBILICAL ADULT WITH MESH (N/A Abdomen)  Patient location during evaluation: PACU Anesthesia Type: General Level of consciousness: awake and alert Pain management: satisfactory to patient Vital Signs Assessment: post-procedure vital signs reviewed and stable Respiratory status: spontaneous breathing, nonlabored ventilation and non-rebreather facemask Cardiovascular status: stable Postop Assessment: no apparent nausea or vomiting Anesthetic complications: no     Last Vitals:  Vitals:   04/03/17 1315 04/03/17 1330  BP: 122/76 128/78  Pulse: 81 80  Resp: 19 16  Temp:    SpO2: 98% 98%    Last Pain:  Vitals:   04/03/17 1330  TempSrc:   PainSc: Asleep                 Ladiamond Gallina

## 2017-04-03 NOTE — Op Note (Signed)
Patient:  Allen BennettDouglas B Palmer  DOB:  03/12/1981  MRN:  [id number]   Preop Diagnosis: Umbilical hernia  Postop Diagnosis: Same  Procedure: Umbilical herniorrhaphy with mesh  Surgeon: Franky MachoMark Tranquilino Fischler, MD  Anes: General endotracheal  Indications: Patient is a 36 year old white male who presents with a symptomatic umbilical hernia.  The risks and benefits of the procedure including bleeding, infection, mesh use, and the possibility of recurrence of the hernia were fully explained to the patient, who gave informed consent.  Procedure note: The patient was placed in the supine position.  After induction of general endotracheal anesthesia, the abdomen was prepped and draped using the usual sterile technique with ChloraPrep.  Surgical site confirmation was performed.  An infraumbilical incision was made down to the fascia.  The hernia sac was freed away from the umbilicus.  The patient had omentum present in the hernia sac which was difficult to fully reduce due to the narrowed area of the opening.  A LigaSure was used to remove excess omentum in order to help reduce the hernia.  The omentum was disposed of.  The ultimate defect measured approximately 2 cm in its greatest diameter.  A medium size Bard 6.4 cm Ventralax ST patch was then placed into the abdomen once a finger sweep was performed.  It was attached using its tabs to the fascia using 0 Ethibond interrupted sutures.  The overlying fascia was then closed transversely over the patch using 0 Ethibond interrupted sutures.  The base of the umbilicus was carried back to the fascia using a 2-0 Vicryl interrupted suture.  Subcutaneous layer was reapproximated using a 3-0 Vicryl interrupted suture.  Half percent Sensorcaine was instilled into the surrounding wound.  The skin was closed using staples.  A dry sterile dressing was then applied.  All tape needle counts were correct at the end of the procedure.  The patient was extubated in the operating room and  transferred to PACU in stable condition.  Complications: None  EBL: Minimal  Specimen: None

## 2017-04-03 NOTE — Transfer of Care (Signed)
Immediate Anesthesia Transfer of Care Note  Patient: Allen BennettDouglas B Cassetta  Procedure(s) Performed: HERNIA REPAIR UMBILICAL ADULT WITH MESH (N/A Abdomen)  Patient Location: PACU  Anesthesia Type:General  Level of Consciousness: awake and patient cooperative  Airway & Oxygen Therapy: Patient Spontanous Breathing and non-rebreather face mask  Post-op Assessment: Report given to RN and Post -op Vital signs reviewed and stable  Post vital signs: Reviewed and stable  Last Vitals:  Vitals:   04/03/17 1037  BP: (!) 151/98  Pulse: (!) 104  Temp: 36.5 C  SpO2: 97%    Last Pain:  Vitals:   04/03/17 1037  TempSrc: Oral  PainSc:          Complications: No apparent anesthesia complications

## 2017-04-04 ENCOUNTER — Encounter (HOSPITAL_COMMUNITY): Payer: Self-pay | Admitting: General Surgery

## 2017-04-20 ENCOUNTER — Ambulatory Visit (INDEPENDENT_AMBULATORY_CARE_PROVIDER_SITE_OTHER): Payer: Self-pay | Admitting: General Surgery

## 2017-04-20 ENCOUNTER — Encounter: Payer: Self-pay | Admitting: General Surgery

## 2017-04-20 VITALS — BP 135/81 | HR 101 | Temp 98.2°F | Ht 73.0 in | Wt 275.0 lb

## 2017-04-20 DIAGNOSIS — Z09 Encounter for follow-up examination after completed treatment for conditions other than malignant neoplasm: Secondary | ICD-10-CM

## 2017-04-20 NOTE — Progress Notes (Signed)
Subjective:     Allen Palmer  Status post umbilical herniorrhaphy with mesh.  Doing well.  Mild incisional pain. Objective:    BP 135/81   Pulse (!) 101   Temp 98.2 F (36.8 C)   Ht 6\' 1"  (1.854 m)   Wt 275 lb (124.7 kg)   BMI 36.28 kg/m   General:  alert, cooperative and no distress  Abdomen soft, incision healing well.  Staples removed, Steri-Strips applied.     Assessment:    Doing well postoperatively.    Plan:   Increase activity as able.  Follow-up here as needed.

## 2017-05-24 ENCOUNTER — Other Ambulatory Visit (HOSPITAL_COMMUNITY): Payer: Self-pay | Admitting: Family Medicine

## 2017-05-24 DIAGNOSIS — M545 Low back pain: Secondary | ICD-10-CM

## 2017-06-01 ENCOUNTER — Ambulatory Visit (HOSPITAL_COMMUNITY): Payer: Medicare Other

## 2017-06-01 ENCOUNTER — Encounter (HOSPITAL_COMMUNITY): Payer: Self-pay

## 2017-06-07 ENCOUNTER — Ambulatory Visit (HOSPITAL_COMMUNITY)
Admission: RE | Admit: 2017-06-07 | Discharge: 2017-06-07 | Disposition: A | Discharge status: Home / Self Care | Payer: Medicare Other | Source: Ambulatory Visit | Attending: Family Medicine | Admitting: Family Medicine

## 2017-06-07 DIAGNOSIS — M545 Low back pain: Secondary | ICD-10-CM

## 2017-06-19 ENCOUNTER — Encounter (HOSPITAL_COMMUNITY): Payer: Self-pay

## 2017-06-19 ENCOUNTER — Ambulatory Visit (HOSPITAL_COMMUNITY): Payer: Medicare Other

## 2017-06-19 ENCOUNTER — Ambulatory Visit (HOSPITAL_COMMUNITY)
Admission: RE | Admit: 2017-06-19 | Discharge: 2017-06-19 | Disposition: A | Discharge status: Home / Self Care | Payer: Medicare Other | Source: Ambulatory Visit | Attending: Family Medicine | Admitting: Family Medicine

## 2017-06-19 DIAGNOSIS — M545 Low back pain: Secondary | ICD-10-CM | POA: Diagnosis not present

## 2018-04-26 DIAGNOSIS — M545 Low back pain: Secondary | ICD-10-CM | POA: Diagnosis not present

## 2018-04-26 DIAGNOSIS — M5126 Other intervertebral disc displacement, lumbar region: Secondary | ICD-10-CM | POA: Diagnosis not present

## 2018-04-26 DIAGNOSIS — E1142 Type 2 diabetes mellitus with diabetic polyneuropathy: Secondary | ICD-10-CM | POA: Diagnosis not present

## 2018-05-11 DIAGNOSIS — Z0000 Encounter for general adult medical examination without abnormal findings: Secondary | ICD-10-CM | POA: Diagnosis not present

## 2018-05-11 DIAGNOSIS — Z79899 Other long term (current) drug therapy: Secondary | ICD-10-CM | POA: Diagnosis not present

## 2018-05-11 DIAGNOSIS — E1142 Type 2 diabetes mellitus with diabetic polyneuropathy: Secondary | ICD-10-CM | POA: Diagnosis not present

## 2018-05-13 DIAGNOSIS — Z0100 Encounter for examination of eyes and vision without abnormal findings: Secondary | ICD-10-CM | POA: Diagnosis not present

## 2018-05-13 DIAGNOSIS — E119 Type 2 diabetes mellitus without complications: Secondary | ICD-10-CM | POA: Diagnosis not present

## 2018-05-16 DIAGNOSIS — M5126 Other intervertebral disc displacement, lumbar region: Secondary | ICD-10-CM | POA: Diagnosis not present

## 2018-05-16 DIAGNOSIS — M545 Low back pain: Secondary | ICD-10-CM | POA: Diagnosis not present

## 2018-05-16 DIAGNOSIS — Z0001 Encounter for general adult medical examination with abnormal findings: Secondary | ICD-10-CM | POA: Diagnosis not present

## 2018-05-16 DIAGNOSIS — E785 Hyperlipidemia, unspecified: Secondary | ICD-10-CM | POA: Diagnosis not present

## 2018-05-16 DIAGNOSIS — E1142 Type 2 diabetes mellitus with diabetic polyneuropathy: Secondary | ICD-10-CM | POA: Diagnosis not present

## 2018-06-28 DIAGNOSIS — G4700 Insomnia, unspecified: Secondary | ICD-10-CM | POA: Diagnosis not present

## 2018-06-28 DIAGNOSIS — E1142 Type 2 diabetes mellitus with diabetic polyneuropathy: Secondary | ICD-10-CM | POA: Diagnosis not present

## 2018-06-28 DIAGNOSIS — M545 Low back pain: Secondary | ICD-10-CM | POA: Diagnosis not present

## 2018-10-10 DIAGNOSIS — E1142 Type 2 diabetes mellitus with diabetic polyneuropathy: Secondary | ICD-10-CM | POA: Diagnosis not present

## 2018-10-10 DIAGNOSIS — Z79899 Other long term (current) drug therapy: Secondary | ICD-10-CM | POA: Diagnosis not present

## 2018-10-10 DIAGNOSIS — E785 Hyperlipidemia, unspecified: Secondary | ICD-10-CM | POA: Diagnosis not present

## 2018-10-11 DIAGNOSIS — E1142 Type 2 diabetes mellitus with diabetic polyneuropathy: Secondary | ICD-10-CM | POA: Diagnosis not present

## 2018-10-11 DIAGNOSIS — G4700 Insomnia, unspecified: Secondary | ICD-10-CM | POA: Diagnosis not present

## 2018-10-11 DIAGNOSIS — Z79899 Other long term (current) drug therapy: Secondary | ICD-10-CM | POA: Diagnosis not present

## 2018-10-11 DIAGNOSIS — E785 Hyperlipidemia, unspecified: Secondary | ICD-10-CM | POA: Diagnosis not present

## 2018-10-11 DIAGNOSIS — M545 Low back pain: Secondary | ICD-10-CM | POA: Diagnosis not present

## 2018-11-15 DIAGNOSIS — Z79899 Other long term (current) drug therapy: Secondary | ICD-10-CM | POA: Diagnosis not present

## 2018-11-15 DIAGNOSIS — E785 Hyperlipidemia, unspecified: Secondary | ICD-10-CM | POA: Diagnosis not present

## 2018-11-15 DIAGNOSIS — I10 Essential (primary) hypertension: Secondary | ICD-10-CM | POA: Diagnosis not present

## 2018-12-19 DIAGNOSIS — Z79899 Other long term (current) drug therapy: Secondary | ICD-10-CM | POA: Diagnosis not present

## 2018-12-19 DIAGNOSIS — I10 Essential (primary) hypertension: Secondary | ICD-10-CM | POA: Diagnosis not present

## 2018-12-19 DIAGNOSIS — E1142 Type 2 diabetes mellitus with diabetic polyneuropathy: Secondary | ICD-10-CM | POA: Diagnosis not present

## 2018-12-19 DIAGNOSIS — E785 Hyperlipidemia, unspecified: Secondary | ICD-10-CM | POA: Diagnosis not present

## 2019-01-31 DIAGNOSIS — E1165 Type 2 diabetes mellitus with hyperglycemia: Secondary | ICD-10-CM | POA: Diagnosis not present

## 2019-01-31 DIAGNOSIS — I10 Essential (primary) hypertension: Secondary | ICD-10-CM | POA: Diagnosis not present

## 2019-01-31 DIAGNOSIS — Z79899 Other long term (current) drug therapy: Secondary | ICD-10-CM | POA: Diagnosis not present

## 2019-02-25 DIAGNOSIS — E1165 Type 2 diabetes mellitus with hyperglycemia: Secondary | ICD-10-CM | POA: Diagnosis not present

## 2019-02-25 DIAGNOSIS — E785 Hyperlipidemia, unspecified: Secondary | ICD-10-CM | POA: Diagnosis not present

## 2019-02-25 DIAGNOSIS — I10 Essential (primary) hypertension: Secondary | ICD-10-CM | POA: Diagnosis not present

## 2019-02-26 DIAGNOSIS — R945 Abnormal results of liver function studies: Secondary | ICD-10-CM | POA: Diagnosis not present

## 2019-02-26 DIAGNOSIS — E1142 Type 2 diabetes mellitus with diabetic polyneuropathy: Secondary | ICD-10-CM | POA: Diagnosis not present

## 2019-02-26 DIAGNOSIS — E785 Hyperlipidemia, unspecified: Secondary | ICD-10-CM | POA: Diagnosis not present

## 2019-02-26 DIAGNOSIS — Z79899 Other long term (current) drug therapy: Secondary | ICD-10-CM | POA: Diagnosis not present

## 2019-02-26 DIAGNOSIS — I10 Essential (primary) hypertension: Secondary | ICD-10-CM | POA: Diagnosis not present

## 2019-06-04 ENCOUNTER — Other Ambulatory Visit: Payer: Self-pay

## 2019-06-04 NOTE — Patient Outreach (Signed)
[facility]) Care Management  06/04/2019  Allen Palmer 02-23-1981 [id number]   Medication Adherence call to Allen Palmer HIPPA Compliant Voice message left with a call back number. Allen Palmer is showing past due on Ozempic under [employer].   Lillia Abed CPhT Pharmacy Technician [facility] Care Management Direct Dial [phone]  Fax [phone] [email or web address]

## 2019-06-10 DIAGNOSIS — E785 Hyperlipidemia, unspecified: Secondary | ICD-10-CM | POA: Diagnosis not present

## 2019-06-10 DIAGNOSIS — I10 Essential (primary) hypertension: Secondary | ICD-10-CM | POA: Diagnosis not present

## 2019-06-10 DIAGNOSIS — Z79899 Other long term (current) drug therapy: Secondary | ICD-10-CM | POA: Diagnosis not present

## 2019-06-10 DIAGNOSIS — E1142 Type 2 diabetes mellitus with diabetic polyneuropathy: Secondary | ICD-10-CM | POA: Diagnosis not present

## 2019-06-10 DIAGNOSIS — R945 Abnormal results of liver function studies: Secondary | ICD-10-CM | POA: Diagnosis not present

## 2019-06-10 DIAGNOSIS — F17210 Nicotine dependence, cigarettes, uncomplicated: Secondary | ICD-10-CM | POA: Diagnosis not present

## 2019-06-19 ENCOUNTER — Other Ambulatory Visit: Payer: Self-pay

## 2019-06-19 NOTE — Patient Outreach (Signed)
[facility]) Care Management  06/19/2019  DEQUINCY BORN 1980/08/25 [id number]   Medication Adherence call to Mr. Derrek Puff left a message for patient to call back with patients mother. Mr. Asher is showing past due on Ozempic Is showing past due under [employer].  Marland Kitchenthn Lillia Abed CPhT Pharmacy Technician [facility] Management Direct Dial [phone]  Fax [phone] [email or web address]

## 2019-06-24 DIAGNOSIS — I10 Essential (primary) hypertension: Secondary | ICD-10-CM | POA: Diagnosis not present

## 2019-06-24 DIAGNOSIS — Z79899 Other long term (current) drug therapy: Secondary | ICD-10-CM | POA: Diagnosis not present

## 2019-06-24 DIAGNOSIS — Z13228 Encounter for screening for other metabolic disorders: Secondary | ICD-10-CM | POA: Diagnosis not present

## 2019-06-24 DIAGNOSIS — E1142 Type 2 diabetes mellitus with diabetic polyneuropathy: Secondary | ICD-10-CM | POA: Diagnosis not present

## 2019-06-24 DIAGNOSIS — E785 Hyperlipidemia, unspecified: Secondary | ICD-10-CM | POA: Diagnosis not present

## 2019-06-24 DIAGNOSIS — R945 Abnormal results of liver function studies: Secondary | ICD-10-CM | POA: Diagnosis not present

## 2019-06-26 DIAGNOSIS — I10 Essential (primary) hypertension: Secondary | ICD-10-CM | POA: Diagnosis not present

## 2019-06-26 DIAGNOSIS — E1142 Type 2 diabetes mellitus with diabetic polyneuropathy: Secondary | ICD-10-CM | POA: Diagnosis not present

## 2019-06-26 DIAGNOSIS — G4700 Insomnia, unspecified: Secondary | ICD-10-CM | POA: Diagnosis not present

## 2019-06-26 DIAGNOSIS — Z79899 Other long term (current) drug therapy: Secondary | ICD-10-CM | POA: Diagnosis not present

## 2019-06-26 DIAGNOSIS — E785 Hyperlipidemia, unspecified: Secondary | ICD-10-CM | POA: Diagnosis not present

## 2019-07-03 DIAGNOSIS — R519 Headache, unspecified: Secondary | ICD-10-CM | POA: Diagnosis not present

## 2019-07-03 DIAGNOSIS — K219 Gastro-esophageal reflux disease without esophagitis: Secondary | ICD-10-CM | POA: Diagnosis not present

## 2019-07-03 DIAGNOSIS — R197 Diarrhea, unspecified: Secondary | ICD-10-CM | POA: Diagnosis not present

## 2019-07-04 DIAGNOSIS — Z79899 Other long term (current) drug therapy: Secondary | ICD-10-CM | POA: Diagnosis not present

## 2019-07-19 DIAGNOSIS — Z79891 Long term (current) use of opiate analgesic: Secondary | ICD-10-CM | POA: Diagnosis not present

## 2020-01-22 ENCOUNTER — Other Ambulatory Visit: Payer: Medicare Other

## 2020-01-22 ENCOUNTER — Other Ambulatory Visit: Payer: Self-pay

## 2020-01-22 DIAGNOSIS — Z20822 Contact with and (suspected) exposure to covid-19: Secondary | ICD-10-CM

## 2020-01-23 LAB — NOVEL CORONAVIRUS, NAA: SARS-CoV-2, NAA: NOT DETECTED

## 2020-01-23 LAB — SPECIMEN STATUS REPORT

## 2020-01-23 LAB — SARS-COV-2, NAA 2 DAY TAT

## 2020-01-27 IMAGING — MR MR LUMBAR SPINE W/O CM
4 of 5 series · 15 of 48 positions shown · non-contrast
Comparison: None.

CLINICAL DATA: Low back pain radiating down the leg for 2 years

EXAM:
MRI LUMBAR SPINE WITHOUT CONTRAST
TECHNIQUE: Multiplanar, multisequence MR imaging of the lumbar spine was
performed. No intravenous contrast was administered.

[Series 3: T2 · sagittal · 4.0mm · 0.79mm/px · 6 of 15 slices shown (1 of 2)]
[im 1/15]
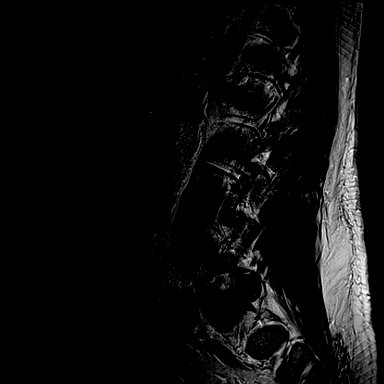
[im 3/15]
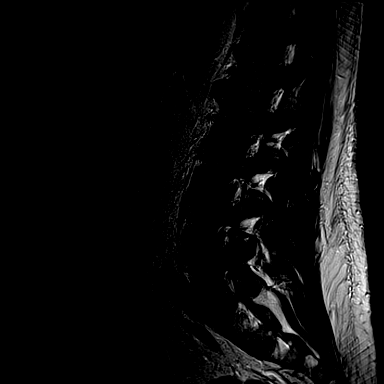
[im 6/15]
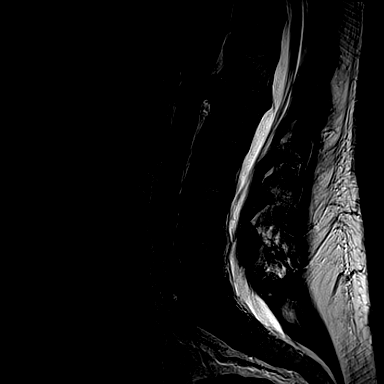
[im 9/15]
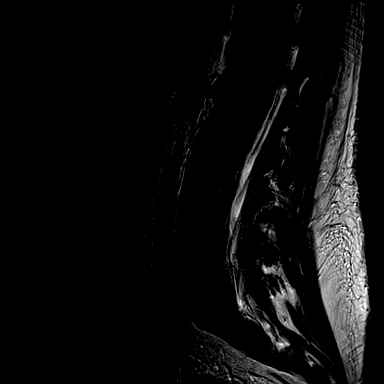
[im 12/15]
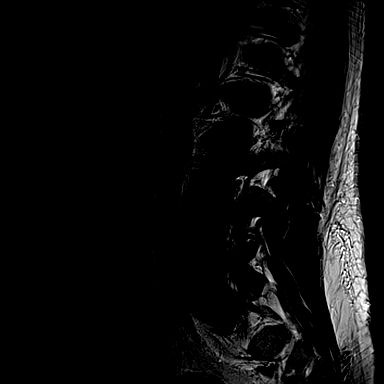
[im 15/15]
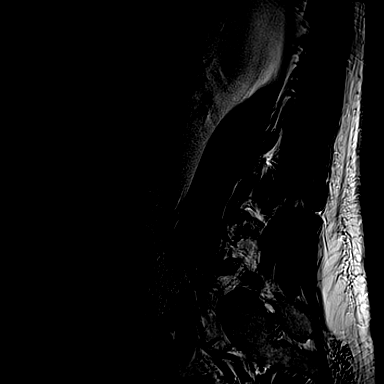

[Series 4: T1 · sagittal · 4.0mm · 0.40mm/px · 3 of 15 slices shown (1 of 2)]
[im 1/15]
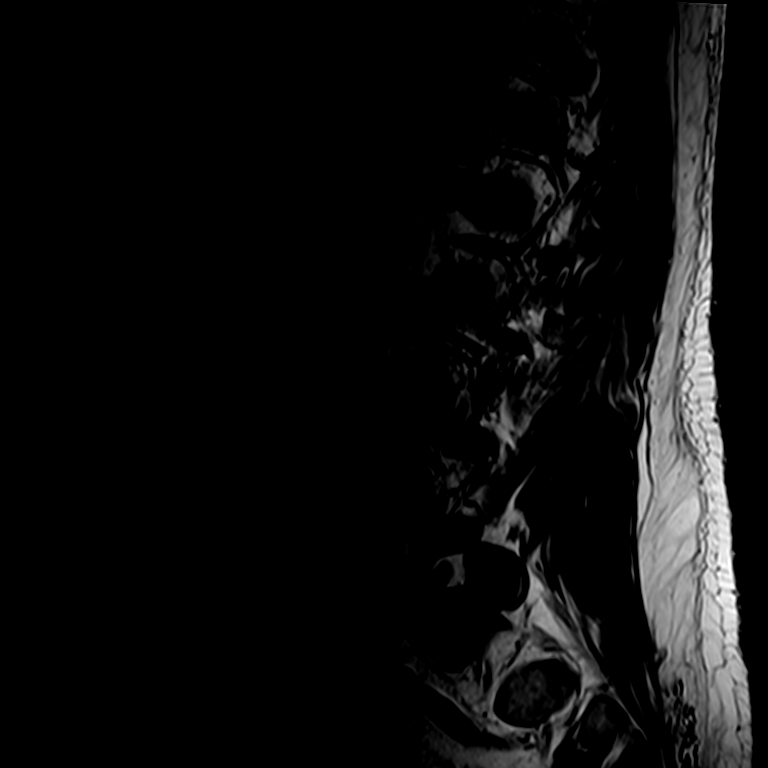
[im 8/15]
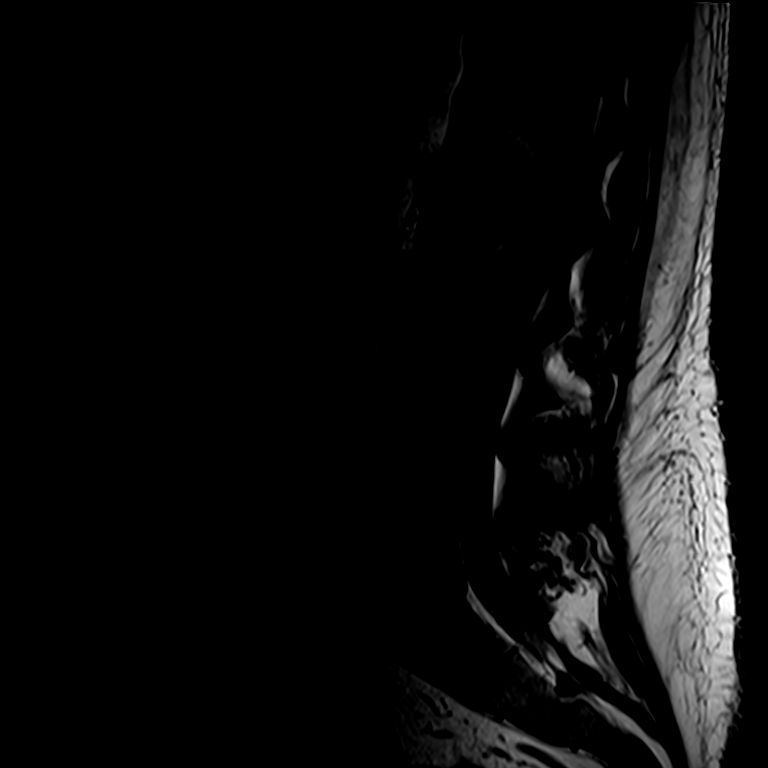
[im 15/15]
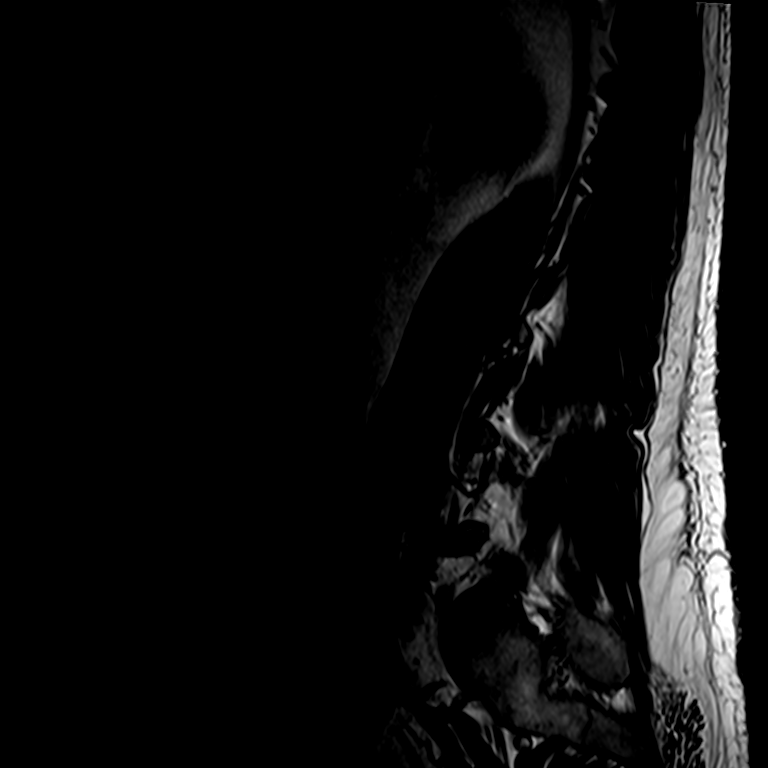

[Series 6: T2 · axial · 4.0mm · 0.28mm/px · z∈[+30,+211]mm · 3 of 45 slices shown (2 of 2)]
[im 6/45]
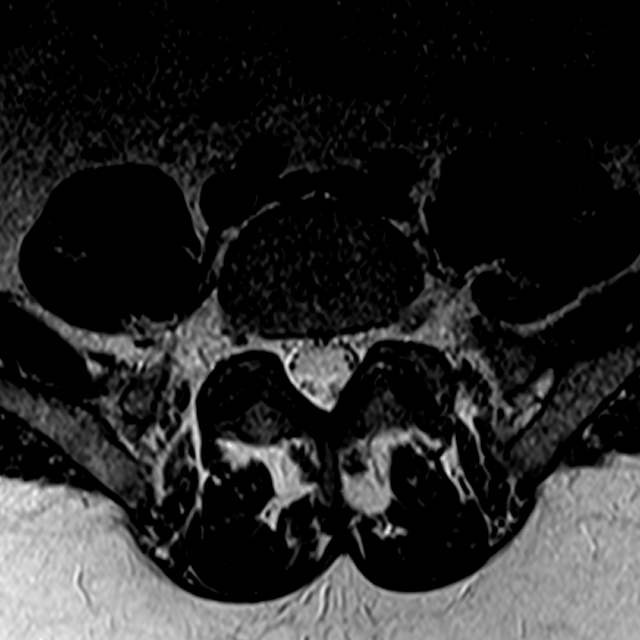
[im 24/45]
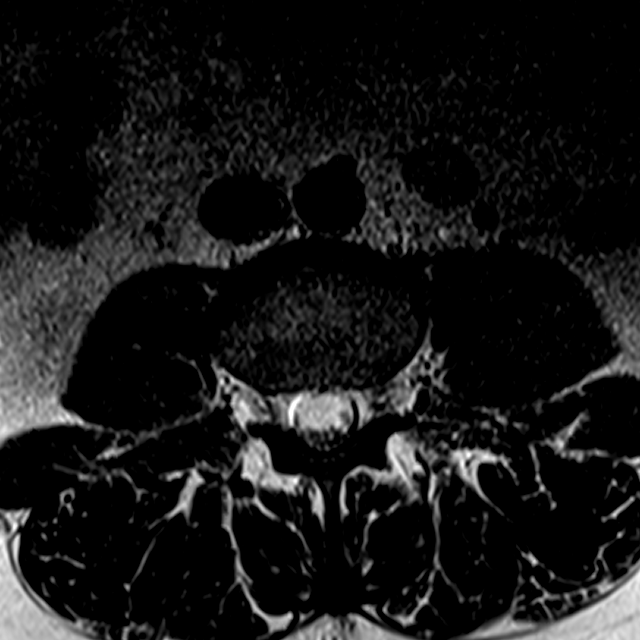
[im 39/45]
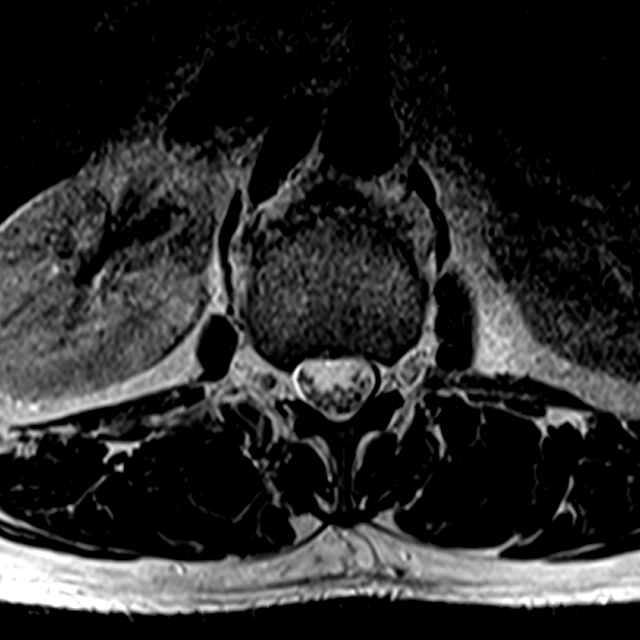

[Series 7: T1 · axial · 4.0mm · 0.28mm/px · z∈[+30,+211]mm · 3 of 45 slices shown (2 of 2)]
[im 6/45]
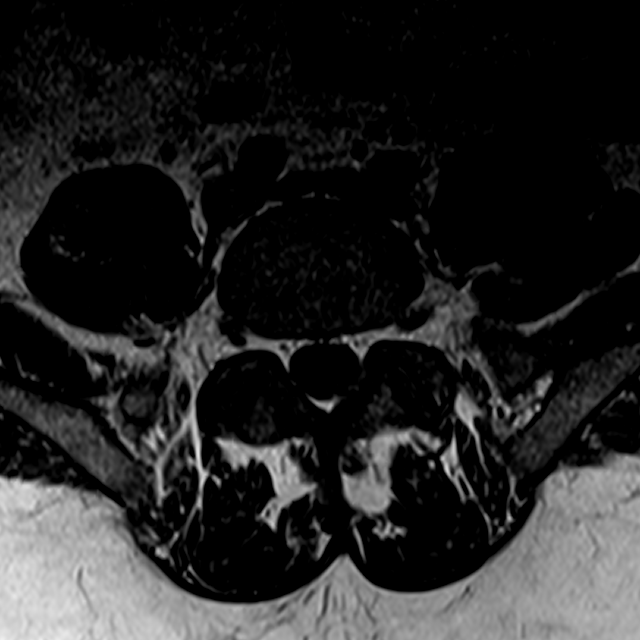
[im 24/45]
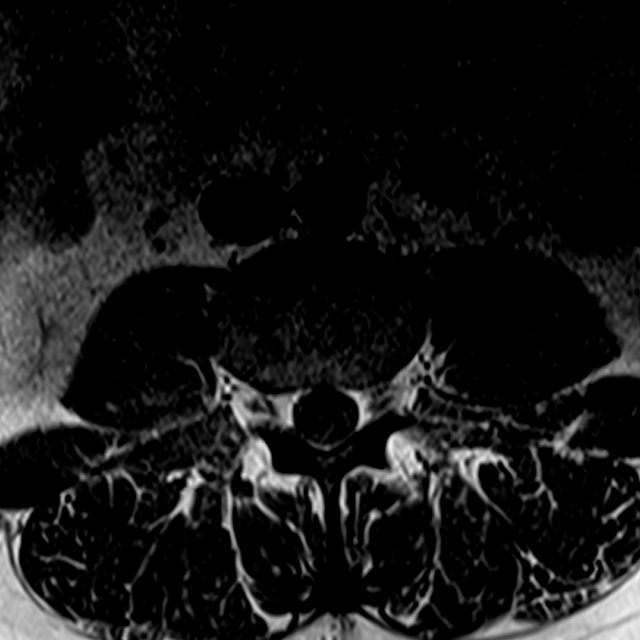
[im 39/45]
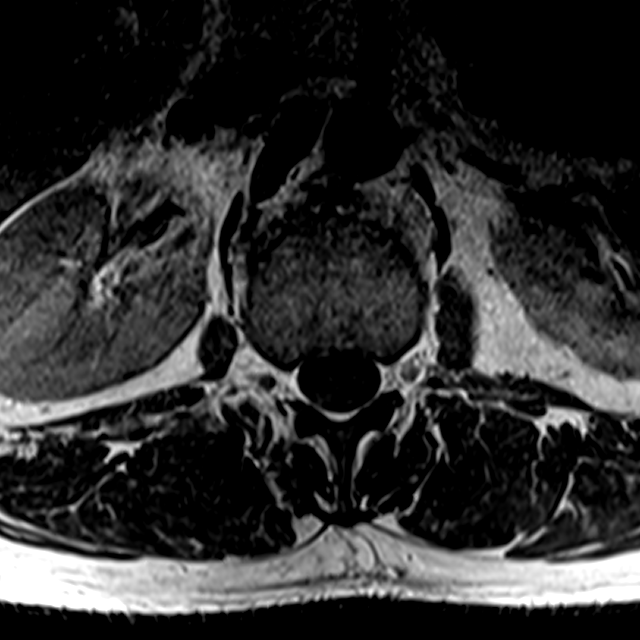

[15 of 48 positions shown; findings below may reference images not displayed]

FINDINGS: Segmentation:  Standard.

Alignment:  Physiologic.

Vertebrae: Chronic T12 vertebral body compression fracture with
approximately 25% height loss. No acute fracture, evidence of
discitis, or bone lesion.

Conus medullaris and cauda equina: Conus extends to the T12 level.
Conus and cauda equina appear normal.

Paraspinal and other soft tissues: No acute paraspinal abnormality.

Disc levels:

Disc spaces: Degenerative disc disease disc height loss at T12-L1,
L1-2 and L5-S1.

T12-L1: No significant disc bulge. No evidence of neural foraminal
stenosis. No central canal stenosis.

L1-L2: Mild broad-based disc bulge. No evidence of neural foraminal
stenosis. No central canal stenosis.

L2-L3: No significant disc bulge. No evidence of neural foraminal
stenosis. No central canal stenosis.

L3-L4: No significant disc bulge. No evidence of neural foraminal
stenosis. No central canal stenosis.

L4-L5: No significant disc bulge. No evidence of neural foraminal
stenosis. No central canal stenosis.

L5-S1: No significant disc bulge. No evidence of neural foraminal
stenosis. No central canal stenosis.
IMPRESSION: 1. At L1-2 there is a mild broad-based disc bulge. No foraminal or
central canal stenosis.
